# Patient Record
Sex: Female | Born: 1935 | Race: White | Hispanic: No | Smoking: Never smoker
Health system: Southern US, Community
[De-identification: ages and names within clinical notes are randomized; demographics above are authoritative.]

## PROBLEM LIST (undated history)

## (undated) DIAGNOSIS — I499 Cardiac arrhythmia, unspecified: Secondary | ICD-10-CM

## (undated) DIAGNOSIS — R0600 Dyspnea, unspecified: Secondary | ICD-10-CM

## (undated) DIAGNOSIS — Z972 Presence of dental prosthetic device (complete) (partial): Secondary | ICD-10-CM

## (undated) DIAGNOSIS — M1990 Unspecified osteoarthritis, unspecified site: Secondary | ICD-10-CM

## (undated) DIAGNOSIS — I509 Heart failure, unspecified: Secondary | ICD-10-CM

## (undated) DIAGNOSIS — E049 Nontoxic goiter, unspecified: Secondary | ICD-10-CM

## (undated) DIAGNOSIS — G4700 Insomnia, unspecified: Secondary | ICD-10-CM

## (undated) DIAGNOSIS — Z974 Presence of external hearing-aid: Secondary | ICD-10-CM

## (undated) DIAGNOSIS — I4891 Unspecified atrial fibrillation: Secondary | ICD-10-CM

## (undated) DIAGNOSIS — R42 Dizziness and giddiness: Secondary | ICD-10-CM

## (undated) HISTORY — PX: TONSILLECTOMY: SUR1361

## (undated) HISTORY — DX: Heart failure, unspecified: I50.9

## (undated) HISTORY — PX: CHOLECYSTECTOMY: SHX55

## (undated) HISTORY — PX: COLONOSCOPY: SHX174

## (undated) HISTORY — PX: HERNIA REPAIR: SHX51

---

## 2006-06-06 ENCOUNTER — Ambulatory Visit: Payer: Self-pay | Admitting: Unknown Physician Specialty

## 2006-10-31 ENCOUNTER — Ambulatory Visit: Payer: Self-pay | Admitting: *Deleted

## 2008-01-01 ENCOUNTER — Ambulatory Visit: Payer: Self-pay | Admitting: *Deleted

## 2009-05-05 ENCOUNTER — Inpatient Hospital Stay: Payer: Self-pay | Admitting: Internal Medicine

## 2010-04-20 ENCOUNTER — Ambulatory Visit: Payer: Self-pay | Admitting: Family Medicine

## 2012-02-04 ENCOUNTER — Ambulatory Visit: Payer: Self-pay | Admitting: Unknown Physician Specialty

## 2013-03-22 ENCOUNTER — Ambulatory Visit: Payer: Self-pay | Admitting: Family Medicine

## 2013-08-20 ENCOUNTER — Ambulatory Visit: Payer: Self-pay

## 2013-08-21 ENCOUNTER — Ambulatory Visit: Payer: Self-pay | Admitting: Family Medicine

## 2013-10-23 ENCOUNTER — Ambulatory Visit: Payer: Self-pay | Admitting: Family Medicine

## 2013-11-17 ENCOUNTER — Ambulatory Visit: Payer: Self-pay | Admitting: Physician Assistant

## 2013-11-20 ENCOUNTER — Ambulatory Visit: Payer: Self-pay | Admitting: Physician Assistant

## 2013-12-16 ENCOUNTER — Ambulatory Visit: Payer: Self-pay | Admitting: Emergency Medicine

## 2014-04-03 ENCOUNTER — Ambulatory Visit: Payer: Self-pay | Admitting: Internal Medicine

## 2014-05-15 ENCOUNTER — Ambulatory Visit: Payer: Self-pay | Admitting: Family Medicine

## 2014-05-15 LAB — URINALYSIS, COMPLETE
Bacteria: NEGATIVE
Bilirubin,UR: NEGATIVE
Blood: NEGATIVE
Glucose,UR: NEGATIVE mg/dL (ref 0–75)
Hyaline Cast: >30
Ketone: NEGATIVE
Nitrite: NEGATIVE
Ph: 6 (ref 4.5–8.0)
Protein: NEGATIVE
Specific Gravity: 1.025 (ref 1.003–1.030)

## 2014-05-15 LAB — COMPREHENSIVE METABOLIC PANEL
Albumin: 3.5 g/dL (ref 3.4–5.0)
Alkaline Phosphatase: 98 U/L
Anion Gap: 12 (ref 7–16)
BUN: 13 mg/dL (ref 7–18)
Bilirubin,Total: 0.2 mg/dL (ref 0.2–1.0)
Calcium, Total: 8.9 mg/dL (ref 8.5–10.1)
Chloride: 104 mmol/L (ref 98–107)
Co2: 25 mmol/L (ref 21–32)
Creatinine: 1.16 mg/dL (ref 0.60–1.30)
EGFR (African American): 53 — ABNORMAL LOW
EGFR (Non-African Amer.): 46 — ABNORMAL LOW
Glucose: 106 mg/dL — ABNORMAL HIGH (ref 65–99)
Osmolality: 282 (ref 275–301)
Potassium: 4.1 mmol/L (ref 3.5–5.1)
SGOT(AST): 17 U/L (ref 15–37)
SGPT (ALT): 23 U/L (ref 12–78)
Sodium: 141 mmol/L (ref 136–145)
Total Protein: 6.9 g/dL (ref 6.4–8.2)

## 2014-05-15 LAB — CBC WITH DIFFERENTIAL/PLATELET
Basophil #: 0.1 10*3/uL (ref 0.0–0.1)
Basophil %: 1 %
Eosinophil #: 0.1 10*3/uL (ref 0.0–0.7)
Eosinophil %: 2.1 %
HCT: 36.9 % (ref 35.0–47.0)
HGB: 11.5 g/dL — ABNORMAL LOW (ref 12.0–16.0)
Lymphocyte #: 1.1 10*3/uL (ref 1.0–3.6)
Lymphocyte %: 19.6 %
MCH: 25.4 pg — ABNORMAL LOW (ref 26.0–34.0)
MCHC: 31.3 g/dL — ABNORMAL LOW (ref 32.0–36.0)
MCV: 81 fL (ref 80–100)
Monocyte #: 0.7 x10 3/mm (ref 0.2–0.9)
Monocyte %: 12.1 %
Neutrophil #: 3.7 10*3/uL (ref 1.4–6.5)
Neutrophil %: 65.2 %
Platelet: 268 10*3/uL (ref 150–440)
RBC: 4.54 10*6/uL (ref 3.80–5.20)
RDW: 17 % — ABNORMAL HIGH (ref 11.5–14.5)
WBC: 5.7 10*3/uL (ref 3.6–11.0)

## 2014-05-15 LAB — TSH: Thyroid Stimulating Horm: 1.63 u[IU]/mL

## 2014-08-22 ENCOUNTER — Ambulatory Visit: Payer: Self-pay

## 2014-10-03 ENCOUNTER — Emergency Department: Payer: Self-pay | Admitting: Emergency Medicine

## 2014-10-03 LAB — URINALYSIS, COMPLETE
Bacteria: NONE SEEN
Bilirubin,UR: NEGATIVE
Glucose,UR: NEGATIVE mg/dL (ref 0–75)
Hyaline Cast: 2
Ketone: NEGATIVE
Leukocyte Esterase: NEGATIVE
Nitrite: NEGATIVE
Ph: 6 (ref 4.5–8.0)
Protein: NEGATIVE
RBC,UR: 1 /HPF (ref 0–5)
Specific Gravity: 1.017 (ref 1.003–1.030)
Squamous Epithelial: NONE SEEN
WBC UR: 1 /HPF (ref 0–5)

## 2014-10-03 LAB — CBC WITH DIFFERENTIAL/PLATELET
Basophil #: 0.1 10*3/uL (ref 0.0–0.1)
Basophil %: 0.5 %
Eosinophil #: 0 10*3/uL (ref 0.0–0.7)
Eosinophil %: 0.2 %
HCT: 42.6 % (ref 35.0–47.0)
HGB: 13.2 g/dL (ref 12.0–16.0)
Lymphocyte #: 0.9 10*3/uL — ABNORMAL LOW (ref 1.0–3.6)
Lymphocyte %: 8.6 %
MCH: 27.5 pg (ref 26.0–34.0)
MCHC: 30.9 g/dL — ABNORMAL LOW (ref 32.0–36.0)
MCV: 89 fL (ref 80–100)
Monocyte #: 0.4 x10 3/mm (ref 0.2–0.9)
Monocyte %: 3.7 %
Neutrophil #: 9.4 10*3/uL — ABNORMAL HIGH (ref 1.4–6.5)
Neutrophil %: 87 %
Platelet: 232 10*3/uL (ref 150–440)
RBC: 4.79 10*6/uL (ref 3.80–5.20)
RDW: 15 % — ABNORMAL HIGH (ref 11.5–14.5)
WBC: 10.8 10*3/uL (ref 3.6–11.0)

## 2014-10-03 LAB — COMPREHENSIVE METABOLIC PANEL
Albumin: 3.5 g/dL (ref 3.4–5.0)
Alkaline Phosphatase: 98 U/L
Anion Gap: 10 (ref 7–16)
BUN: 15 mg/dL (ref 7–18)
Bilirubin,Total: 0.6 mg/dL (ref 0.2–1.0)
Calcium, Total: 8.6 mg/dL (ref 8.5–10.1)
Chloride: 108 mmol/L — ABNORMAL HIGH (ref 98–107)
Co2: 23 mmol/L (ref 21–32)
Creatinine: 0.9 mg/dL (ref 0.60–1.30)
EGFR (African American): >60
EGFR (Non-African Amer.): >60
Glucose: 105 mg/dL — ABNORMAL HIGH (ref 65–99)
Osmolality: 282 (ref 275–301)
Potassium: 4.3 mmol/L (ref 3.5–5.1)
SGOT(AST): 32 U/L (ref 15–37)
SGPT (ALT): 27 U/L
Sodium: 141 mmol/L (ref 136–145)
Total Protein: 6.9 g/dL (ref 6.4–8.2)

## 2014-10-03 LAB — LIPASE, BLOOD: Lipase: 151 U/L (ref 73–393)

## 2014-10-03 LAB — APTT: Activated PTT: 26.4 secs (ref 23.6–35.9)

## 2014-10-03 LAB — PROTIME-INR
INR: 1
Prothrombin Time: 13.3 secs (ref 11.5–14.7)

## 2015-03-28 DIAGNOSIS — I480 Paroxysmal atrial fibrillation: Secondary | ICD-10-CM | POA: Diagnosis present

## 2015-03-28 DIAGNOSIS — E782 Mixed hyperlipidemia: Secondary | ICD-10-CM | POA: Diagnosis present

## 2015-05-27 ENCOUNTER — Ambulatory Visit
Admission: EM | Admit: 2015-05-27 | Discharge: 2015-05-27 | Disposition: A | Discharge status: Home / Self Care | Payer: Medicare Other | Attending: Internal Medicine | Admitting: Internal Medicine

## 2015-05-27 ENCOUNTER — Other Ambulatory Visit: Payer: Self-pay

## 2015-05-27 ENCOUNTER — Encounter: Payer: Self-pay | Admitting: Emergency Medicine

## 2015-05-27 DIAGNOSIS — R42 Dizziness and giddiness: Secondary | ICD-10-CM

## 2015-05-27 DIAGNOSIS — I4891 Unspecified atrial fibrillation: Secondary | ICD-10-CM | POA: Diagnosis not present

## 2015-05-27 DIAGNOSIS — G4700 Insomnia, unspecified: Secondary | ICD-10-CM

## 2015-05-27 DIAGNOSIS — Z79899 Other long term (current) drug therapy: Secondary | ICD-10-CM | POA: Diagnosis not present

## 2015-05-27 DIAGNOSIS — F5104 Psychophysiologic insomnia: Secondary | ICD-10-CM | POA: Insufficient documentation

## 2015-05-27 DIAGNOSIS — R0981 Nasal congestion: Secondary | ICD-10-CM | POA: Diagnosis not present

## 2015-05-27 HISTORY — DX: Insomnia, unspecified: G47.00

## 2015-05-27 HISTORY — DX: Unspecified atrial fibrillation: I48.91

## 2015-05-27 MED ORDER — FLUTICASONE PROPIONATE 50 MCG/ACT NA SUSP: 2.0000 | Freq: Every day | NASAL | Status: DC

## 2015-05-27 NOTE — ED Provider Notes (Addendum)
CSN: [id number]     Arrival date & time 05/27/15  6237 History   First MD Initiated Contact with Patient 05/27/15 [phone]     Chief Complaint  Patient presents with  . Dizziness   HPI  Patient reports dizziness, also sneezing, nasal congestion, occasional nose irritation and bleeding. She has additional concerns about gradually progressive decline in activity level over the last year or so, has to stop and rest more often than she had previously. Chronic insomnia, recent change in term Ambien therapy which she is upset about. Still remains very active, cares for horses, cleans houses, most grass, but is able to tolerate less activity than she had in the past. Under Dr. Alveria Apley care, and cardiology. PCP is Dr. Hoy Morn. No fevers. Not falling down. Describes dizziness as rotatory, but is most pronounced when she is changing position, going from sitting to standing.  Past Medical History  Diagnosis Date  . Insomnia   . A-fib    History reviewed. No pertinent past surgical history. No family history on file. History  Substance Use Topics  . Smoking status: Never Smoker   . Smokeless tobacco: Never Used  . Alcohol Use: No   OB History    No data available     Review of Systems  All other systems reviewed and are negative.   Allergies  Review of patient's allergies indicates no known allergies.  Home Medications   Prior to Admission medications   Medication Sig Start Date End Date Taking? Authorizing Provider  zolpidem (AMBIEN) 5 MG tablet Take 5 mg by mouth at bedtime as needed for sleep.   Yes Historical Provider, MD  Trazodone 50mg   Takes 2 qhs intermittently. Zolpidem decreased from 10mg  qhs a month or so ago, takes 10mg  qod now.  BP 141/75 mmHg  Pulse 60  Temp(Src) 98 F (36.7 C) (Oral)  Resp 16  Ht 5' 8.5" (1.74 m)  Wt 157 lb (71.215 kg)  BMI 23.52 kg/m2  SpO2 99% Physical Exam  Constitutional: She is oriented to person, place, and time. No distress.  Alert, nicely  groomed  HENT:  Head: Atraumatic.  Bilateral TMs dull, no erythema Pronounced nasal congestion bilaterally Throat with scant post nasal drainage  Eyes:  Conjugate gaze, no eye redness/drainage  Neck: Neck supple.  Cardiovascular: Normal rate and regular rhythm.   Pulmonary/Chest: No respiratory distress. She has no wheezes. She has no rales.  Lungs clear, symmetric breath sounds  Abdominal: She exhibits no distension.  Musculoskeletal: Normal range of motion.  No leg swelling  Neurological: She is alert and oriented to person, place, and time.  Skin: Skin is warm and dry.  No cyanosis  Nursing note and vitals reviewed.   ED Course  Procedures (including critical care time) Labs Review Labs Reviewed - No data to display  Imaging Review No results found.   EKG was performed in the urgent care, and was notable for sinus bradycardia, no acute ST or T-wave changes appreciated. Heart rate 58. Normal axis.  Orthostatic VS for the past 24 hrs:  BP- Lying Pulse- Lying BP- Sitting Pulse- Sitting BP- Standing at 0 minutes Pulse- Standing at 0 minutes  05/27/15 0918 128/60 mmHg 58 126/70 mmHg 61 111/57 mmHg 68      MDM   1. Sinus congestion   2. Dizziness   3. Insomnia    Prescription for nasal steroid, to try and improve sinus congestion, which may contribute to dizziness. Encouraged her to drink extra fluids, because of  the orthostatic opponent of her dizziness. Discussed the possibility that change in medications, including dose change in Ambien recently, or intermittent use of trazodone, may be contributing to dizziness. Follow-up with Dr. Ruby Cola in the next week or 2.    Sherlene Shams, MD 05/27/15 1020  Sherlene Shams, MD 05/27/15 2102

## 2015-05-27 NOTE — ED Notes (Signed)
Has been going on for two months- room spinning sensation. Cannot walk straight. Feels like she is going sideways. Reports that her ears have been itching as well. Reports worsening SOB and burning chest pain when she "goes out to feed the horses" or other activity.

## 2015-05-27 NOTE — Discharge Instructions (Signed)
Try nasal steroid spray for congestion, to see if dizziness improves.  Dizziness can also be related to mild dehydration or changes in medication.  Prescription for nasal steroid spray sent to pharmacy.  Dizziness Dizziness is a common problem. It is a feeling of unsteadiness or light-headedness. You may feel like you are about to faint. Dizziness can lead to injury if you stumble or fall. A person of any age group can suffer from dizziness, but dizziness is more common in older adults. CAUSES  Dizziness can be caused by many different things, including:  Middle ear problems.  Standing for too long.  Infections.  An allergic reaction.  Aging.  An emotional response to something, such as the sight of blood.  Side effects of medicines.  Tiredness.  Problems with circulation or blood pressure.  Excessive use of alcohol or medicines, or illegal drug use.  Breathing too fast (hyperventilation).  An irregular heart rhythm (arrhythmia).  A low red blood cell count (anemia).  Pregnancy.  Vomiting, diarrhea, fever, or other illnesses that cause body fluid loss (dehydration).  Diseases or conditions such as Parkinson's disease, high blood pressure (hypertension), diabetes, and thyroid problems.  Exposure to extreme heat. DIAGNOSIS  Your health care provider will ask about your symptoms, perform a physical exam, and perform an electrocardiogram (ECG) to record the electrical activity of your heart. Your health care provider may also perform other heart or blood tests to determine the cause of your dizziness. These may include:  Transthoracic echocardiogram (TTE). During echocardiography, sound waves are used to evaluate how blood flows through your heart.  Transesophageal echocardiogram (TEE).  Cardiac monitoring. This allows your health care provider to monitor your heart rate and rhythm in real time.  Holter monitor. This is a portable device that records your heartbeat and  can help diagnose heart arrhythmias. It allows your health care provider to track your heart activity for several days if needed.  Stress tests by exercise or by giving medicine that makes the heart beat faster. TREATMENT  Treatment of dizziness depends on the cause of your symptoms and can vary greatly. HOME CARE INSTRUCTIONS   Drink enough fluids to keep your urine clear or pale yellow. This is especially important in very hot weather. In older adults, it is also important in cold weather.  Take your medicine exactly as directed if your dizziness is caused by medicines. When taking blood pressure medicines, it is especially important to get up slowly.  Rise slowly from chairs and steady yourself until you feel okay.  In the morning, first sit up on the side of the bed. When you feel okay, stand slowly while holding onto something until you know your balance is fine.  Move your legs often if you need to stand in one place for a long time. Tighten and relax your muscles in your legs while standing.  Have someone stay with you for 1-2 days if dizziness continues to be a problem. Do this until you feel you are well enough to stay alone. Have the person call your health care provider if he or she notices changes in you that are concerning.  Do not drive or use heavy machinery if you feel dizzy.  Do not drink alcohol. SEEK IMMEDIATE MEDICAL CARE IF:   Your dizziness or light-headedness gets worse.  You feel nauseous or vomit.  You have problems talking, walking, or using your arms, hands, or legs.  You feel weak.  You are not thinking clearly or you  have trouble forming sentences. It may take a friend or family member to notice this.  You have chest pain, abdominal pain, shortness of breath, or sweating.  Your vision changes.  You notice any bleeding.  You have side effects from medicine that seems to be getting worse rather than better. MAKE SURE YOU:   Understand these  instructions.  Will watch your condition.  Will get help right away if you are not doing well or get worse. Document Released: 06/01/2001 Document Revised: 12/11/2013 Document Reviewed: 06/25/2011 [facility] Patient Information 2015 [place], [place]. This information is not intended to replace advice given to you by your health care provider. Make sure you discuss any questions you have with your health care provider.

## 2016-05-22 ENCOUNTER — Emergency Department: Payer: Medicare Other

## 2016-05-22 ENCOUNTER — Emergency Department
Admission: EM | Admit: 2016-05-22 | Discharge: 2016-05-22 | Disposition: A | Discharge status: Home / Self Care | Payer: Medicare Other | Attending: Emergency Medicine | Admitting: Emergency Medicine

## 2016-05-22 DIAGNOSIS — Z79899 Other long term (current) drug therapy: Secondary | ICD-10-CM | POA: Insufficient documentation

## 2016-05-22 DIAGNOSIS — Z7951 Long term (current) use of inhaled steroids: Secondary | ICD-10-CM | POA: Diagnosis not present

## 2016-05-22 DIAGNOSIS — R0789 Other chest pain: Secondary | ICD-10-CM | POA: Insufficient documentation

## 2016-05-22 DIAGNOSIS — I4891 Unspecified atrial fibrillation: Secondary | ICD-10-CM | POA: Diagnosis not present

## 2016-05-22 DIAGNOSIS — R079 Chest pain, unspecified: Secondary | ICD-10-CM

## 2016-05-22 LAB — BASIC METABOLIC PANEL
Anion gap: 9 (ref 5–15)
BUN: 18 mg/dL (ref 6–20)
CO2: 22 mmol/L (ref 22–32)
Calcium: 9.2 mg/dL (ref 8.9–10.3)
Chloride: 104 mmol/L (ref 101–111)
Creatinine, Ser: 0.86 mg/dL (ref 0.44–1.00)
GFR calc Af Amer: >60 mL/min (ref >60)
GFR calc non Af Amer: >60 mL/min (ref >60)
Glucose, Bld: 115 mg/dL — ABNORMAL HIGH (ref 65–99)
Potassium: 4.5 mmol/L (ref 3.5–5.1)
Sodium: 135 mmol/L (ref 135–145)

## 2016-05-22 LAB — CBC
HCT: 40.4 % (ref 35.0–47.0)
Hemoglobin: 13.4 g/dL (ref 12.0–16.0)
MCH: 28.8 pg (ref 26.0–34.0)
MCHC: 33.1 g/dL (ref 32.0–36.0)
MCV: 87 fL (ref 80.0–100.0)
Platelets: 242 10*3/uL (ref 150–440)
RBC: 4.65 MIL/uL (ref 3.80–5.20)
RDW: 14.2 % (ref 11.5–14.5)
WBC: 8.3 10*3/uL (ref 3.6–11.0)

## 2016-05-22 LAB — TROPONIN I: Troponin I: <0.03 ng/mL (ref <0.031)

## 2016-05-22 MED ORDER — RANITIDINE HCL 75 MG PO TABS: 75.0000 mg | ORAL_TABLET | Freq: Two times a day (BID) | ORAL | Status: DC

## 2016-05-22 MED ORDER — GI COCKTAIL ~~LOC~~
30.0000 mL | Freq: Once | ORAL | Status: AC
  Administered 2016-05-22: 30 mL via ORAL
  Filled 2016-05-22: qty 30

## 2016-05-22 NOTE — ED Notes (Addendum)
Pt reports chest pain and pressure for past 3 days that has gotten worse since last night. Pt reports heavy feeling like a knot nausea and sob.

## 2016-05-22 NOTE — ED Notes (Signed)
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 Pt states that she has heaviness in her chest, states that she has more effort to take a breath, pt is tender with light palpation to the epigastric area, breath sounds clear, pt states an episode of a.fib at one point, ekg in nsr at this time. No distress noted at this time, additional warm blanket given for comfort

## 2016-05-22 NOTE — ED Provider Notes (Signed)
[facility] Emergency Department Provider Note   ____________________________________________  Time seen: Approximately 230 PM  I have reviewed the triage vital signs and the nursing notes.   HISTORY  Chief Complaint Chest Pain   HPI Katelyn Lee is a 80 y.o. female with a history of insomnia and atrial fibrillation is presenting to the emergency department today after experiencing 24 hours of chest pressure. 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 She says that the anxiety made her not be able to sleep and then she started to experience chest pressure which lasted several hours. She says that she is much improved with the chest pressure is continuing and constant. Says that she has felt like this in the past and was having atrial fibrillation. However, the atrial fibrillation was isolated and she is not on any medications for this at this time. It was recommended that she take a baby aspirin but she does not. She says that she feels a heaviness across the front of her chest which does not radiate. Does not describe it as pain. Says that she has not had any vomiting but does feel nauseous. Says that she eats a lot of fast food and has a history of indigestion. Says that she does not hurt after she eats but does feel that she needs to belch. Denies any pain with deep inspiration.Says that she still feels nauseous.   Past Medical History  Diagnosis Date  . Insomnia   . A-fib     There are no active problems to display for this patient.   No past surgical history on file.  Current Outpatient Rx  Name  Route  Sig  Dispense  Refill  . fluticasone (FLONASE) 50 MCG/ACT nasal spray   Each Nare   Place 2 sprays into both nostrils daily.   16 g   0   . zolpidem (AMBIEN) 5 MG tablet   Oral   Take 5 mg by mouth at bedtime as needed for sleep.           Allergies Review of patient's allergies indicates no  known allergies.  No family history on file.  Social History Social History  Substance Use Topics  . Smoking status: Never Smoker   . Smokeless tobacco: Never Used  . Alcohol Use: No    Review of Systems Constitutional: No fever/chills Eyes: No visual changes. ENT: No sore throat. Cardiovascular: As above Respiratory: As above Gastrointestinal:   No constipation. Genitourinary: Negative for dysuria. Musculoskeletal: Negative for back pain. Skin: Negative for rash. Neurological: Negative for headaches, focal weakness or numbness.  10-point ROS otherwise negative.  ____________________________________________   PHYSICAL EXAM:  VITAL SIGNS: ED Triage Vitals  Enc Vitals Group     BP 05/22/16 1114 141/75 mmHg     Pulse Rate 05/22/16 1114 73     Resp 05/22/16 1114 18     Temp 05/22/16 1114 98.7 F (37.1 C)     Temp Source 05/22/16 1114 Oral     SpO2 05/22/16 1114 100 %     Weight 05/22/16 1114 150 lb (68.04 kg)     Height 05/22/16 1114 5\' 8"  (1.727 m)     Head Cir --      Peak Flow --      Pain Score 05/22/16 1114 0     Pain Loc --      Pain Edu? --      Excl. in [place]? --  Constitutional: Alert and oriented. Well appearing and in no acute distress. Eyes: Conjunctivae are normal. PERRL. EOMI. Head: Atraumatic. Nose: No congestion/rhinnorhea. Mouth/Throat: Mucous membranes are moist.   Neck: No stridor.   Cardiovascular: Normal rate, regular rhythm. Grossly normal heart sounds.  Good peripheral circulation.Chest pain is reproducible to palpation over the sternum. Respiratory: Normal respiratory effort.  No retractions. Lungs CTAB. Gastrointestinal: Soft with mild left upper quadrant tenderness palpation. No rebound or guarding. Negative Murphy sign. No right upper quadrant tenderness to palpation. No distention.Marland KitchenNo CVA tenderness. Musculoskeletal: No lower extremity tenderness nor edema.  No joint effusions. Neurologic:  Normal speech and language. No gross focal  neurologic deficits are appreciated. No gait instability. Skin:  Skin is warm, dry and intact. No rash noted. Psychiatric: Mood and affect are normal. Speech and behavior are normal.  ____________________________________________   LABS (all labs ordered are listed, but only abnormal results are displayed)  Labs Reviewed  BASIC METABOLIC PANEL - Abnormal; Notable for the following:    Glucose, Bld 115 (*)    All other components within normal limits  CBC  TROPONIN I   ____________________________________________  EKG  ED ECG REPORT I, Doran Stabler, the attending physician, personally viewed and interpreted this ECG.   Date: 05/22/2016  EKG Time: 1111  Rate: 78  Rhythm: normal sinus rhythm  Axis: Normal  Intervals:none  ST&T Change: No ST segment elevation or depression. No abnormal T-wave inversion.  ____________________________________________  [id number]   DG Chest 2 View (Final result) Result time: 05/22/16 11:53:25   Final result by Rad Results In Interface (05/22/16 11:53:25)   Narrative:   CLINICAL DATA: Chest pain and pressure beginning 3 days ago, worsening.  EXAM: CHEST 2 VIEW  COMPARISON: 05/05/2009  FINDINGS: Mild hyperinflation. Biapical scarring. Heart is normal size. No confluent airspace opacities or effusions. No acute bony abnormality.  IMPRESSION: Hyperinflation/COPD. Chronic changes. No active disease.   Electronically Signed By: Rolm Baptise M.D. On: 05/22/2016 11:53    ____________________________________________   PROCEDURES   ____________________________________________   INITIAL IMPRESSION / ASSESSMENT AND PLAN / ED COURSE  Pertinent labs & imaging results that were available during my care of the patient were reviewed by me and considered in my medical decision making (see chart for details).  ----------------------------------------- 3:16 PM on  05/22/2016 -----------------------------------------  Patient awaiting GI cocktail at this time. Signed out to Dr. Marcelene Butte. Very reassuring workup after about 18 hours of symptoms. Feel that this is likely anxiety or GI related. ____________________________________________   FINAL CLINICAL IMPRESSION(S) / ED DIAGNOSES  Chest pain.    NEW MEDICATIONS STARTED DURING THIS VISIT:  New Prescriptions   No medications on file     Note:  This document was prepared using Dragon voice recognition software and may include unintentional dictation errors.    Orbie Pyo, MD 05/22/16 [phone]

## 2016-05-22 NOTE — ED Provider Notes (Signed)
-----------------------------------------   3:57 PM on 05/22/2016 -----------------------------------------   Blood pressure 159/78, pulse 73, temperature 98.7 F (37.1 C), temperature source Oral, resp. rate 12, height 5\' 8"  (1.727 m), weight 150 lb (68.04 kg), SpO2 100 %.  Assuming care from Dr. Konrad Penta.  In short, Katelyn Lee is a 80 y.o. female with a chief complaint of Chest Pain .  Refer to the original H&P for additional details.  The current plan of care is to follow patient clinically and if she improves with a GI cocktail visibly more evidence that this is not a life-threatening pathology patient be discharged on antacid medications and does feel improved after the GI cocktail. All questions and concerns were addressed at the bedside the patient was discharged in stable and improved condition.   Daymon Larsen, MD 05/22/16 [phone]

## 2016-08-29 ENCOUNTER — Ambulatory Visit
Admission: EM | Admit: 2016-08-29 | Discharge: 2016-08-29 | Disposition: A | Discharge status: Home / Self Care | Payer: Medicare Other | Attending: Family Medicine | Admitting: Family Medicine

## 2016-08-29 ENCOUNTER — Telehealth: Payer: Self-pay

## 2016-08-29 ENCOUNTER — Encounter: Payer: Self-pay | Admitting: Emergency Medicine

## 2016-08-29 DIAGNOSIS — J3089 Other allergic rhinitis: Secondary | ICD-10-CM | POA: Diagnosis not present

## 2016-08-29 DIAGNOSIS — J069 Acute upper respiratory infection, unspecified: Secondary | ICD-10-CM

## 2016-08-29 DIAGNOSIS — I4891 Unspecified atrial fibrillation: Secondary | ICD-10-CM | POA: Diagnosis present

## 2016-08-29 LAB — TROPONIN I: Troponin I: <0.03 ng/mL (ref <0.03)

## 2016-08-29 LAB — BASIC METABOLIC PANEL
Anion gap: 8 (ref 5–15)
BUN: 10 mg/dL (ref 6–20)
CO2: 26 mmol/L (ref 22–32)
Calcium: 9.5 mg/dL (ref 8.9–10.3)
Chloride: 104 mmol/L (ref 101–111)
Creatinine, Ser: 0.84 mg/dL (ref 0.44–1.00)
GFR calc Af Amer: >60 mL/min (ref >60)
GFR calc non Af Amer: >60 mL/min (ref >60)
Glucose, Bld: 110 mg/dL — ABNORMAL HIGH (ref 65–99)
Potassium: 3.9 mmol/L (ref 3.5–5.1)
Sodium: 138 mmol/L (ref 135–145)

## 2016-08-29 LAB — CBC WITH DIFFERENTIAL/PLATELET
Basophils Absolute: 0.1 10*3/uL (ref 0–0.1)
Basophils Relative: 1 %
Eosinophils Absolute: 0.2 10*3/uL (ref 0–0.7)
Eosinophils Relative: 3 %
HCT: 43.7 % (ref 35.0–47.0)
Hemoglobin: 14.3 g/dL (ref 12.0–16.0)
Lymphocytes Relative: 27 %
Lymphs Abs: 1.7 10*3/uL (ref 1.0–3.6)
MCH: 29 pg (ref 26.0–34.0)
MCHC: 32.8 g/dL (ref 32.0–36.0)
MCV: 88.3 fL (ref 80.0–100.0)
Monocytes Absolute: 0.4 10*3/uL (ref 0.2–0.9)
Monocytes Relative: 6 %
Neutro Abs: 4.1 10*3/uL (ref 1.4–6.5)
Neutrophils Relative %: 63 %
Platelets: 231 10*3/uL (ref 150–440)
RBC: 4.94 MIL/uL (ref 3.80–5.20)
RDW: 14.3 % (ref 11.5–14.5)
WBC: 6.5 10*3/uL (ref 3.6–11.0)

## 2016-08-29 LAB — CKMB (ARMC ONLY): CK, MB: 1.2 ng/mL (ref 0.5–5.0)

## 2016-08-29 NOTE — Discharge Instructions (Signed)
Will call you with lab results and if any significant problems with the results will recommend going to the ER for further evaluation and treatment. Patient addresses understanding of this. I've advised her to stop the decongestant and just take an oral antihistamine, rest, hydration, Tylenol when necessary.

## 2016-08-29 NOTE — ED Provider Notes (Signed)
[facility]    CSN: [id number] Arrival date & time: 08/29/16  0801  First Provider Contact:  None       History   Chief Complaint Chief Complaint  Patient presents with  . Nasal Congestion    HPI TYRISHA FITZHENRY is a 80 y.o. female.   HPI: Patient presents today with symptoms of nasal congestion, fatigue, decreased appetite, chest discomfort with cough at times. She denies any documented fever or chills. She has had the symptoms for the last few days. She denies any abdominal pain, nausea, vomiting. She did have episode of diarrhea a few days ago when the symptoms started. She does have history of A. fib but only takes aspirin daily. She did try taking a decongestant but did not feel well when she took it so she stopped taking it.  Past Medical History:  Diagnosis Date  . A-fib ([place])   . Insomnia     There are no active problems to display for this patient.   Past Surgical History:  Procedure Laterality Date  . CHOLECYSTECTOMY      OB History    No data available       Home Medications    Prior to Admission medications   Medication Sig Start Date End Date Taking? Authorizing Provider  aspirin 81 MG chewable tablet Chew 81 mg by mouth daily.   Yes Historical Provider, MD  fluticasone (FLONASE) 50 MCG/ACT nasal spray Place 2 sprays into both nostrils daily. 05/27/15   Sherlene Shams, MD  ranitidine (ZANTAC) 75 MG tablet Take 1 tablet (75 mg total) by mouth 2 (two) times daily. 05/22/16   Orbie Pyo, MD  zolpidem (AMBIEN) 5 MG tablet Take 5 mg by mouth at bedtime as needed for sleep.    Historical Provider, MD    Family History History reviewed. No pertinent family history.  Social History Social History  Substance Use Topics  . Smoking status: Never Smoker  . Smokeless tobacco: Never Used  . Alcohol use No     Allergies   Review of patient's allergies indicates no known allergies.   Review of Systems Review of Systems: Negative  except mentioned above.   Physical Exam Triage Vital Signs ED Triage Vitals  Enc Vitals Group     BP 08/29/16 0821 136/79     Pulse Rate 08/29/16 0821 63     Resp 08/29/16 0821 16     Temp 08/29/16 0821 97.6 F (36.4 C)     Temp Source 08/29/16 0821 Tympanic     SpO2 08/29/16 0821 100 %     Weight 08/29/16 0822 150 lb (68 kg)     Height 08/29/16 0822 5\' 8"  (1.727 m)     Head Circumference --      Peak Flow --      Pain Score 08/29/16 0823 0     Pain Loc --      Pain Edu? --      Excl. in [place]? --    No data found.   Updated Vital Signs BP 136/79 (BP Location: Right Arm)   Pulse 63   Temp 97.6 F (36.4 C) (Tympanic)   Resp 16   Ht 5\' 8"  (1.727 m)   Wt 150 lb (68 kg)   SpO2 100%   BMI 22.81 kg/m      Physical Exam:  GENERAL: NAD HEENT: no pharyngeal erythema, no exudate, no erythema of TMs, +cerumen bilaterally, no cervical LAD RESP: CTA B CARD:  regular rate and rhythm appreciated  EXTREM: -[place] NEURO: CN II-XII grossly intact    UC Treatments / Results  Labs (all labs ordered are listed, but only abnormal results are displayed) Labs Reviewed - No data to display  EKG  EKG Interpretation None     ECG - NSR-64, no ST elevations or depressions appreciated, p waves present, moderate voltage criteria for LVH, may be normal variant, agree with print out reading of ECG   Radiology No results found.  Procedures Procedures (including critical care time)  Medications Ordered in UC Medications - No data to display   Initial Impression / Assessment and Plan / UC Course  I have reviewed the triage vital signs and the nursing notes.  Pertinent labs & imaging results that were available during my care of the patient were reviewed by me and considered in my medical decision making (see chart for details).  Clinical Course   A/P: URI, allergic rhinitis - discussed with patient to avoid taking decongestants and given history of atrial fibrillation, can take  oral antihistamine if needed such as Claritin, rest, hydration, Tylenol when necessary, follow-up with PMD if symptoms persist or worsen. I have done some cardiac labs as well as D-dimer and will call patient with results. If any concerning results will send patient to the ER for further evaluation and treatment. Patient addresses understanding of this.  Final Clinical Impressions(s) / UC Diagnoses   Final diagnoses:  None    New Prescriptions New Prescriptions   No medications on file     Paulina Fusi, MD 08/29/16 [phone]

## 2016-08-29 NOTE — ED Triage Notes (Signed)
Patient c/o cold symptoms, sneezing, and nasal congestion that started a week ago.  Patient denies fevers.

## 2017-05-04 ENCOUNTER — Other Ambulatory Visit: Payer: Self-pay | Admitting: Gastroenterology

## 2017-05-04 DIAGNOSIS — R1031 Right lower quadrant pain: Secondary | ICD-10-CM

## 2017-05-04 DIAGNOSIS — R1032 Left lower quadrant pain: Secondary | ICD-10-CM

## 2017-05-06 ENCOUNTER — Ambulatory Visit: Admission: RE | Admit: 2017-05-06 | Payer: Self-pay | Source: Ambulatory Visit

## 2017-06-27 ENCOUNTER — Encounter: Payer: Self-pay | Admitting: *Deleted

## 2017-06-28 ENCOUNTER — Ambulatory Visit
Admission: RE | Admit: 2017-06-28 | Discharge: 2017-06-28 | Disposition: A | Discharge status: Home / Self Care | Payer: Medicare HMO | Source: Ambulatory Visit | Attending: Gastroenterology | Admitting: Gastroenterology

## 2017-06-28 ENCOUNTER — Encounter: Payer: Self-pay | Admitting: *Deleted

## 2017-06-28 ENCOUNTER — Encounter
Admission: RE | Disposition: A | Discharge status: Home / Self Care | Payer: Self-pay | Source: Ambulatory Visit | Attending: Gastroenterology

## 2017-06-28 ENCOUNTER — Ambulatory Visit: Payer: Medicare HMO | Admitting: Anesthesiology

## 2017-06-28 DIAGNOSIS — G4700 Insomnia, unspecified: Secondary | ICD-10-CM | POA: Insufficient documentation

## 2017-06-28 DIAGNOSIS — Z79899 Other long term (current) drug therapy: Secondary | ICD-10-CM | POA: Insufficient documentation

## 2017-06-28 DIAGNOSIS — Q438 Other specified congenital malformations of intestine: Secondary | ICD-10-CM | POA: Diagnosis not present

## 2017-06-28 DIAGNOSIS — Z7901 Long term (current) use of anticoagulants: Secondary | ICD-10-CM | POA: Insufficient documentation

## 2017-06-28 DIAGNOSIS — D120 Benign neoplasm of cecum: Secondary | ICD-10-CM | POA: Insufficient documentation

## 2017-06-28 DIAGNOSIS — Z7982 Long term (current) use of aspirin: Secondary | ICD-10-CM | POA: Insufficient documentation

## 2017-06-28 DIAGNOSIS — K5730 Diverticulosis of large intestine without perforation or abscess without bleeding: Secondary | ICD-10-CM | POA: Insufficient documentation

## 2017-06-28 DIAGNOSIS — D123 Benign neoplasm of transverse colon: Secondary | ICD-10-CM | POA: Insufficient documentation

## 2017-06-28 DIAGNOSIS — K219 Gastro-esophageal reflux disease without esophagitis: Secondary | ICD-10-CM | POA: Diagnosis not present

## 2017-06-28 DIAGNOSIS — I4891 Unspecified atrial fibrillation: Secondary | ICD-10-CM | POA: Insufficient documentation

## 2017-06-28 DIAGNOSIS — K640 First degree hemorrhoids: Secondary | ICD-10-CM | POA: Insufficient documentation

## 2017-06-28 DIAGNOSIS — K625 Hemorrhage of anus and rectum: Secondary | ICD-10-CM | POA: Insufficient documentation

## 2017-06-28 HISTORY — DX: Nontoxic goiter, unspecified: E04.9

## 2017-06-28 HISTORY — DX: Cardiac arrhythmia, unspecified: I49.9

## 2017-06-28 HISTORY — PX: COLONOSCOPY WITH PROPOFOL: SHX5780

## 2017-06-28 HISTORY — DX: Dyspnea, unspecified: R06.00

## 2017-06-28 SURGERY — COLONOSCOPY WITH PROPOFOL
Anesthesia: General

## 2017-06-28 MED ORDER — EPHEDRINE SULFATE 50 MG/ML IJ SOLN
INTRAMUSCULAR | Status: AC
  Filled 2017-06-28: qty 1

## 2017-06-28 MED ORDER — PROPOFOL 500 MG/50ML IV EMUL
INTRAVENOUS | Status: AC
  Filled 2017-06-28: qty 50

## 2017-06-28 MED ORDER — EPHEDRINE SULFATE 50 MG/ML IJ SOLN
INTRAMUSCULAR | Status: DC | PRN
  Administered 2017-06-28: 10 mg via INTRAVENOUS
  Administered 2017-06-28: 5 mg via INTRAVENOUS

## 2017-06-28 MED ORDER — FENTANYL CITRATE (PF) 100 MCG/2ML IJ SOLN
INTRAMUSCULAR | Status: AC
  Filled 2017-06-28: qty 2

## 2017-06-28 MED ORDER — SODIUM CHLORIDE 0.9 % IV SOLN: INTRAVENOUS | Status: DC

## 2017-06-28 MED ORDER — MIDAZOLAM HCL 2 MG/2ML IJ SOLN
INTRAMUSCULAR | Status: AC
  Filled 2017-06-28: qty 2

## 2017-06-28 MED ORDER — FENTANYL CITRATE (PF) 100 MCG/2ML IJ SOLN
INTRAMUSCULAR | Status: DC | PRN
  Administered 2017-06-28 (×2): 50 ug via INTRAVENOUS

## 2017-06-28 MED ORDER — SODIUM CHLORIDE 0.9 % IV SOLN
INTRAVENOUS | Status: DC
  Administered 2017-06-28: 13:00:00 via INTRAVENOUS

## 2017-06-28 MED ORDER — MIDAZOLAM HCL 2 MG/2ML IJ SOLN
INTRAMUSCULAR | Status: DC | PRN
  Administered 2017-06-28 (×2): 1 mg via INTRAVENOUS

## 2017-06-28 MED ORDER — PROPOFOL 500 MG/50ML IV EMUL
INTRAVENOUS | Status: DC | PRN
  Administered 2017-06-28: 100 ug/kg/min via INTRAVENOUS

## 2017-06-28 NOTE — Anesthesia Post-op Follow-up Note (Cosign Needed)
Anesthesia QCDR form completed.        

## 2017-06-28 NOTE — Anesthesia Postprocedure Evaluation (Signed)
Anesthesia Post Note  Patient: Katelyn Lee  Procedure(s) Performed: Procedure(s) (LRB): COLONOSCOPY WITH PROPOFOL (N/A)  Patient location during evaluation: Endoscopy Anesthesia Type: General Level of consciousness: awake and alert Pain management: pain level controlled Vital Signs Assessment: post-procedure vital signs reviewed and stable Respiratory status: spontaneous breathing, nonlabored ventilation, respiratory function stable and patient connected to nasal cannula oxygen Cardiovascular status: blood pressure returned to baseline and stable Postop Assessment: no signs of nausea or vomiting Anesthetic complications: no     Last Vitals:  Vitals:   06/28/17 1423 06/28/17 1434  BP: (!) 116/100 134/76  Pulse: 71 72  Resp: 14 15  Temp:      Last Pain:  Vitals:   06/28/17 1404  TempSrc: Tympanic                 Precious Haws Karter Hellmer

## 2017-06-28 NOTE — Transfer of Care (Signed)
Immediate Anesthesia Transfer of Care Note  Patient: Katelyn Lee  Procedure(s) Performed: Procedure(s): COLONOSCOPY WITH PROPOFOL (N/A)  Patient Location: PACU  Anesthesia Type:General  Level of Consciousness: awake and sedated  Airway & Oxygen Therapy: Patient Spontanous Breathing and Patient connected to nasal cannula oxygen  Post-op Assessment: Report given to RN and Post -op Vital signs reviewed and stable  Post vital signs: Reviewed and stable  Last Vitals:  Vitals:   06/28/17 1234  BP: (!) 158/86  Pulse: 71  Resp: 16  Temp: (!) 36.3 C    Last Pain:  Vitals:   06/28/17 1234  TempSrc: Tympanic         Complications: No apparent anesthesia complications

## 2017-06-28 NOTE — H&P (Signed)
Outpatient short stay form Pre-procedure 06/28/2017 1:05 PM Lollie Sails MD  Primary Physician: Dr. Thereasa Distance  Reason for visit:  Colonoscopy  History of present illness:  Patient is a 81 year old female presenting today as above. She has a history of rectal bleeding that occurred several weeks ago that lasted for multiple days. She is also been experiencing some lower abdominal pain in association with constipation. She tolerated her prep well. Although her record reflects that she takes Coumadin/warfarin she denies taking this. She does take a 81 mg aspirin daily and no other aspirin products.    Current Facility-Administered Medications:  .  0.9 %  sodium chloride infusion, , Intravenous, Continuous, Lollie Sails, MD .  0.9 %  sodium chloride infusion, , Intravenous, Continuous, Lollie Sails, MD  Prescriptions Prior to Admission  Medication Sig Dispense Refill Last Dose  . gabapentin (NEURONTIN) 300 MG capsule Take 300 mg by mouth at bedtime.   06/27/2017 at Unknown time  . meclizine (ANTIVERT) 12.5 MG tablet Take 12.5 mg by mouth 3 (three) times daily as needed for dizziness.     . warfarin (COUMADIN) 2 MG tablet Take 2 mg by mouth daily.     Marland Kitchen aspirin 81 MG chewable tablet Chew 81 mg by mouth daily.     . fluticasone (FLONASE) 50 MCG/ACT nasal spray Place 2 sprays into both nostrils daily. 16 g 0   . metoprolol tartrate (LOPRESSOR) 25 MG tablet Take 25 mg by mouth 2 (two) times daily.   Not Taking at Unknown time  . ranitidine (ZANTAC) 75 MG tablet Take 1 tablet (75 mg total) by mouth 2 (two) times daily. 60 tablet 0   . zolpidem (AMBIEN) 5 MG tablet Take 5 mg by mouth at bedtime as needed for sleep.        No Known Allergies   Past Medical History:  Diagnosis Date  . A-fib ([place])   . Dyspnea   . Dysrhythmia    A-FIB  . Goiter   . Insomnia     Review of systems:      Physical Exam    Heart and lungs: Regular rate and rhythm without rub or gallop,  lungs are bilaterally clear.    HEENT: Normocephalic atraumatic eyes are anicteric    Other:     Pertinant exam for procedure: Soft nontender nondistended bowel sounds positive normoactive.    Planned proceedures: Colonoscopy and indicated procedures I have discussed the risks benefits and complications of procedures to include not limited to bleeding, infection, perforation and the risk of sedation and the patient wishes to proceed.    Lollie Sails, MD Gastroenterology 06/28/2017  1:05 PM

## 2017-06-28 NOTE — Anesthesia Preprocedure Evaluation (Signed)
Anesthesia Evaluation  Patient identified by MRN, date of birth, ID band Patient awake    Reviewed: Allergy & Precautions, H&P , NPO status , Patient's Chart, lab work & pertinent test results  History of Anesthesia Complications Negative for: history of anesthetic complications  Airway Mallampati: III  TM Distance: <3 FB Neck ROM: limited    Dental  (+) Poor Dentition, Missing, Upper Dentures, Lower Dentures   Pulmonary shortness of breath and with exertion,           Cardiovascular Exercise Tolerance: Good (-) angina+ dysrhythmias Atrial Fibrillation      Neuro/Psych negative neurological ROS  negative psych ROS   GI/Hepatic Neg liver ROS, GERD  Controlled,  Endo/Other  negative endocrine ROS  Renal/GU negative Renal ROS  negative genitourinary   Musculoskeletal   Abdominal   Peds  Hematology negative hematology ROS (+)   Anesthesia Other Findings Past Medical History: No date: A-fib ([facility]) No date: Dyspnea No date: Dysrhythmia     Comment: A-FIB No date: Goiter No date: Insomnia  Past Surgical History: No date: CHOLECYSTECTOMY No date: COLONOSCOPY No date: HERNIA REPAIR No date: TONSILLECTOMY     Reproductive/Obstetrics negative OB ROS                             Anesthesia Physical Anesthesia Plan  ASA: III  Anesthesia Plan: General   Post-op Pain Management:    Induction: Intravenous  PONV Risk Score and Plan:   Airway Management Planned: Natural Airway and Nasal Cannula  Additional Equipment:   Intra-op Plan:   Post-operative Plan:   Informed Consent: I have reviewed the patients History and Physical, chart, labs and discussed the procedure including the risks, benefits and alternatives for the proposed anesthesia with the patient or authorized representative who has indicated his/her understanding and acceptance.   Dental Advisory Given  Plan Discussed  with: Anesthesiologist, CRNA and Surgeon  Anesthesia Plan Comments: (Patient consented for risks of anesthesia including but not limited to:  - adverse reactions to medications - damage to teeth, lips or other oral mucosa - sore throat or hoarseness - Damage to heart, brain, lungs or loss of life  Patient voiced understanding.)        Anesthesia Quick Evaluation

## 2017-06-28 NOTE — Anesthesia Procedure Notes (Signed)
Performed by: COOK-MARTIN, Cynthia Stainback Pre-anesthesia Checklist: Patient identified, Emergency Drugs available, Suction available, Patient being monitored and Timeout performed Patient Re-evaluated:Patient Re-evaluated prior to inductionOxygen Delivery Method: Nasal cannula Preoxygenation: Pre-oxygenation with 100% oxygen Intubation Type: IV induction Placement Confirmation: positive ETCO2 and CO2 detector       

## 2017-06-28 NOTE — Op Note (Signed)
[facility] Gastroenterology Patient Name: Katelyn Lee Procedure Date: 06/28/2017 1:05 PM MRN: [id number] Account #: [id number] Date of Birth: 09-09-1936 Admit Type: Outpatient Age: 81 Room: [facility] ENDO ROOM 3 Gender: Female Note Status: Finalized Procedure:            Colonoscopy Indications:          Lower abdominal pain, Rectal bleeding Providers:            Lollie Sails, MD Referring MD:         Sofie Hartigan (Referring MD) Medicines:            Monitored Anesthesia Care Complications:        No immediate complications. Procedure:            Pre-Anesthesia Assessment:                       - ASA Grade Assessment: III - A patient with severe                        systemic disease.                       After obtaining informed consent, the colonoscope was                        passed under direct vision. Throughout the procedure,                        the patient's blood pressure, pulse, and oxygen                        saturations were monitored continuously. The                        Colonoscope was introduced through the anus and                        advanced to the the cecum, identified by appendiceal                        orifice and ileocecal valve. The colonoscopy was                        unusually difficult due to a tortuous colon. Successful                        completion of the procedure was aided by changing the                        patient to a prone position, using manual pressure and                        changing endoscopes. The patient tolerated the                        procedure well. The quality of the bowel preparation                        was fair. Findings:      Multiple small-mouthed diverticula were found in the sigmoid colon and  descending colon.      Non-bleeding internal hemorrhoids were found during anoscopy. The       hemorrhoids were small and Grade I (internal hemorrhoids that do not        prolapse).      The digital rectal exam was normal.      The colon (entire examined portion) was significantly tortuous.      A 2 mm polyp was found in the transverse colon. The polyp was sessile.       The polyp was removed with a cold biopsy forceps. Resection and       retrieval were complete.      A 2 mm polyp was found in the cecum. The polyp was sessile. The polyp       was removed with a cold biopsy forceps. Resection and retrieval were       complete. Impression:           - Preparation of the colon was fair.                       - Diverticulosis in the sigmoid colon and in the                        descending colon.                       - Non-bleeding internal hemorrhoids.                       - Tortuous colon.                       - No specimens collected. Recommendation:       - Use Citrucel one tablespoon PO daily daily.                       - Miralax 1 capful (17 grams) in 8 ounces of water PO                        daily.                       - Return to GI clinic in 1 month. Procedure Code(s):    --- Professional ---                       [phone], Colonoscopy, flexible; with biopsy, single or                        multiple Diagnosis Code(s):    --- Professional ---                       K64.0, First degree hemorrhoids                       R10.30, Lower abdominal pain, unspecified                       K62.5, Hemorrhage of anus and rectum                       K57.30, Diverticulosis of large intestine without  perforation or abscess without bleeding                       Q43.8, Other specified congenital malformations of                        intestine CPT copyright 2016 [employer]. All rights reserved. The codes documented in this report are preliminary and upon coder review may  be revised to meet current compliance requirements. Lollie Sails, MD 06/28/2017 2:03:28 PM This report has been signed  electronically. Number of Addenda: 0 Note Initiated On: 06/28/2017 1:05 PM Scope Withdrawal Time: 0 hours 12 minutes 2 seconds  Total Procedure Duration: 0 hours 35 minutes 11 seconds       [facility]

## 2017-06-29 ENCOUNTER — Encounter: Payer: Self-pay | Admitting: Gastroenterology

## 2017-06-30 LAB — SURGICAL PATHOLOGY

## 2017-07-29 DIAGNOSIS — I639 Cerebral infarction, unspecified: Secondary | ICD-10-CM

## 2017-07-29 HISTORY — DX: Cerebral infarction, unspecified: I63.9

## 2017-08-24 DIAGNOSIS — Z8673 Personal history of transient ischemic attack (TIA), and cerebral infarction without residual deficits: Secondary | ICD-10-CM

## 2017-09-10 ENCOUNTER — Encounter: Payer: Self-pay | Admitting: *Deleted

## 2017-09-10 ENCOUNTER — Ambulatory Visit
Admission: EM | Admit: 2017-09-10 | Discharge: 2017-09-10 | Disposition: A | Discharge status: Hospice | Payer: Medicare HMO | Attending: Emergency Medicine | Admitting: Emergency Medicine

## 2017-09-10 DIAGNOSIS — I4891 Unspecified atrial fibrillation: Secondary | ICD-10-CM | POA: Diagnosis not present

## 2017-09-10 DIAGNOSIS — I5032 Chronic diastolic (congestive) heart failure: Secondary | ICD-10-CM | POA: Insufficient documentation

## 2017-09-10 DIAGNOSIS — Z8673 Personal history of transient ischemic attack (TIA), and cerebral infarction without residual deficits: Secondary | ICD-10-CM | POA: Diagnosis not present

## 2017-09-10 DIAGNOSIS — Z7901 Long term (current) use of anticoagulants: Secondary | ICD-10-CM | POA: Insufficient documentation

## 2017-09-10 DIAGNOSIS — R55 Syncope and collapse: Secondary | ICD-10-CM | POA: Diagnosis not present

## 2017-09-10 DIAGNOSIS — Z79899 Other long term (current) drug therapy: Secondary | ICD-10-CM | POA: Insufficient documentation

## 2017-09-10 DIAGNOSIS — I110 Hypertensive heart disease with heart failure: Secondary | ICD-10-CM | POA: Diagnosis not present

## 2017-09-10 DIAGNOSIS — R110 Nausea: Secondary | ICD-10-CM | POA: Insufficient documentation

## 2017-09-10 MED ORDER — ONDANSETRON 8 MG PO TBDP
8.0000 mg | ORAL_TABLET | Freq: Once | ORAL | Status: AC
  Administered 2017-09-10: 8 mg via ORAL

## 2017-09-10 NOTE — Discharge Instructions (Signed)
No immediately to the [facility] emergency Department. Let them know if you start having chest pain, shortness of breath, if your headache returns, or for stroke like symptoms

## 2017-09-10 NOTE — ED Triage Notes (Signed)
Also, near syncopal episode yesterday.

## 2017-09-10 NOTE — ED Triage Notes (Signed)
CVA on 9/10 and has had nausea since. Worse today.

## 2017-09-10 NOTE — ED Provider Notes (Signed)
HPI  SUBJECTIVE:  Katelyn Lee is a 81 y.o. female who presents with nausea since her stroke on 07/29/2017. States it got acutely worse yesterday. States that she was unable to sleep last night due to the nausea. She reports a mild headache which has since resolved, overall weakness and states that she becomes easily fatigued. She reports decreased by mouth intake and decreased appetite due to the nausea. She reports an unknown amount of unintentional weight loss since August. She denies slurred speech, arm or leg weakness, blurry or double vision, dysarthria, aphasia, facial droop. No chest pain, palpitations. She reports some shortness of breath this morning. No diaphoresis, coughing, wheezing. No abdominal pain. She had a single episode of normal colored diarrhea yesterday. No urinary complaints. No back pain, lower extremity edema, change in her baseline dyspnea on exertion. No PND, nocturia, orthopnea. No recent change in her medications, she states that she was started on eliquis after the stroke and started having nausea shortly afterwards. States that she is only taking eliquis, Neurontin and Ambien. She has been in her otherwise usual state of health. She states that she is currently not on Florinef as it might have caused her hypertensive urgency that required an ED evaluation 2 weeks ago. No aggravating or alleviating factors. It is not associated with exertion, lying down, eating. She has not tried anything for this. She denies raw or uncooked foods, questionable leftovers or new foods.  Pt also reports a near syncopal episode yesterday while bending over. States that her vision went "completely black" but denies full LOC. This lasted several seconds and then resolved. She denies shortness of breath, palpitations, chest pain or diaphoresis with this episode. States that she has had similar episodes previously but they have not been as bad as yesterday's episode was.   Past medical history  includes A. Fib not on any rate control meds, HTN, orthostatic hypotension,  stroke on 07/29/2017, tubular adenoma, chronic diastolic heart failure with preserved EF of over 50%. Last colonoscopy 06/28/2017.  Cardiology note from 9/17- patient is on apixiban for anticoagulation not Coumadin. Patient has a new problem of hypotension associated with dyspnea fatigue irregular heartbeat near syncope, thought to be either due to her current medical regimen and/or dehydration.  PMD: Dr. Ellison Hughs.  Past Medical History:  Diagnosis Date  . A-fib ([place])   . Dyspnea   . Dysrhythmia    A-FIB  . Goiter   . Insomnia     Past Surgical History:  Procedure Laterality Date  . CHOLECYSTECTOMY    . COLONOSCOPY    . COLONOSCOPY WITH PROPOFOL N/A 06/28/2017   Procedure: COLONOSCOPY WITH PROPOFOL;  Surgeon: Lollie Sails, MD;  Location: [facility] ENDOSCOPY;  Service: Endoscopy;  Laterality: N/A;  . HERNIA REPAIR    . TONSILLECTOMY      History reviewed. No pertinent family history.  Social History  Substance Use Topics  . Smoking status: Never Smoker  . Smokeless tobacco: Never Used  . Alcohol use No    No current facility-administered medications for this encounter.   Current Outpatient Prescriptions:  .  apixaban (ELIQUIS) 5 MG TABS tablet, Take 5 mg by mouth 2 (two) times daily., Disp: , Rfl:  .  gabapentin (NEURONTIN) 300 MG capsule, Take 300 mg by mouth at bedtime., Disp: , Rfl:  .  ranitidine (ZANTAC) 75 MG tablet, Take 1 tablet (75 mg total) by mouth 2 (two) times daily., Disp: 60 tablet, Rfl: 0 .  zolpidem (AMBIEN) 5 MG tablet,  Take 5 mg by mouth at bedtime as needed for sleep., Disp: , Rfl:  .  fluticasone (FLONASE) 50 MCG/ACT nasal spray, Place 2 sprays into both nostrils daily., Disp: 16 g, Rfl: 0 .  meclizine (ANTIVERT) 12.5 MG tablet, Take 12.5 mg by mouth 3 (three) times daily as needed for dizziness., Disp: , Rfl:   Allergies  Allergen Reactions  . Coumadin [Warfarin Sodium]  Rash     ROS  As noted in HPI.   Physical Exam  BP 137/82 (BP Location: Left Arm)   Pulse 78   Temp 97.8 F (36.6 C) (Oral)   Resp 16   Ht 5\' 8"  (1.727 m)   Wt 151 lb (68.5 kg)   SpO2 100%   BMI 22.96 kg/m   Constitutional: Well developed, well nourished, no acute distress Eyes: PERRL, EOMI, conjunctiva normal bilaterally HENT: Normocephalic, atraumatic,mucus membranes moist Respiratory: Clear to auscultation bilaterally, no rales, no wheezing, no rhonchi Cardiovascular: Normal rate and rhythm, no murmurs, no gallops, no rubs GI: Soft, nondistended, normal bowel sounds, nontender, no rebound, no guarding Back: no CVAT skin: No rash, skin intact Musculoskeletal: No edema, no tenderness, no deformities Neurologic: Alert & oriented x 3, CN II-XII grossly intact, no motor deficits, sensation grossly intact Psychiatric: Speech and behavior appropriate   ED Course   Medications  ondansetron (ZOFRAN-ODT) disintegrating tablet 8 mg (8 mg Oral Given 09/10/17 0930)    Orders Placed This Encounter  Procedures  . ED EKG    nausea    Standing Status:   Standing    Number of Occurrences:   1    Order Specific Question:   Reason for Exam    Answer:   Other (See Comments)  . EKG 12-Lead    Standing Status:   Standing    Number of Occurrences:   1   No results found for this or any previous visit (from the past 24 hour(s)). No results found.  ED Clinical Impression  Nausea  Near syncope  ED Assessment/Plan   Previous outside charts reviewed- additional PMH obtained.. As noted in history of present illness.  Previous EKG report sinus rhythm with PVCs, LVH, prolonged QT, nonspecific ST elevation.Unable to see damage.  EKG: Normal sinus rhythm, left axis deviation. Normal intervals, LVH. No ST-T wave changes. No previous EKG hemorrhage for comparison.  Of note, her previous weight in August was 150 pounds, she was 151 today. She was given Zofran 8 mg here with  improvement in her nausea.  The differential for nausea is vast. It may be due to her medications, as a each cause nausea, but the combination itself per [facility] does not cause nausea. Malignancy in the ddx, or it could be central such as a side effect from her stroke or a mass. however I suspect that she is malnourished and dehydrated since this is been going on for some time. She states that it got acutely worse yesterday and I cannot pinpoint any particular reason why. Also concern for the near syncopal episode yesterday, again most likely from dehydration however cannot rule out cardiac etiology. Discussed with patient and family member that I think that she would benefit from a comprehensive workup in the ER to further identify the etiology of her nausea and to rule out serious causes of her presyncopal episode yesterday. She wishes to go to the [facility] emergency Department. Feel that she is stable to go by private vehicle. She agrees with plan.  Meds ordered this encounter  Medications  .  ondansetron (ZOFRAN-ODT) disintegrating tablet 8 mg  . apixaban (ELIQUIS) 5 MG TABS tablet    Sig: Take 5 mg by mouth 2 (two) times daily.    *This clinic note was created using Dragon dictation software. Therefore, there may be occasional mistakes despite careful proofreading.  ?   Melynda Ripple, MD 09/10/17 1036

## 2018-09-27 ENCOUNTER — Other Ambulatory Visit: Payer: Self-pay | Admitting: Gastroenterology

## 2018-09-27 DIAGNOSIS — R1310 Dysphagia, unspecified: Secondary | ICD-10-CM

## 2018-10-02 ENCOUNTER — Ambulatory Visit
Admission: RE | Admit: 2018-10-02 | Discharge: 2018-10-02 | Disposition: A | Discharge status: Home / Self Care | Payer: Medicare HMO | Source: Ambulatory Visit | Attending: Gastroenterology | Admitting: Gastroenterology

## 2018-10-02 DIAGNOSIS — R1314 Dysphagia, pharyngoesophageal phase: Secondary | ICD-10-CM | POA: Insufficient documentation

## 2018-10-02 DIAGNOSIS — K219 Gastro-esophageal reflux disease without esophagitis: Secondary | ICD-10-CM | POA: Diagnosis not present

## 2018-10-02 DIAGNOSIS — R252 Cramp and spasm: Secondary | ICD-10-CM | POA: Diagnosis not present

## 2018-10-02 DIAGNOSIS — R1310 Dysphagia, unspecified: Secondary | ICD-10-CM

## 2019-09-29 ENCOUNTER — Encounter: Payer: Self-pay | Admitting: Emergency Medicine

## 2019-09-29 ENCOUNTER — Ambulatory Visit
Admission: EM | Admit: 2019-09-29 | Discharge: 2019-09-29 | Disposition: A | Discharge status: Home / Self Care | Payer: Medicare Other

## 2019-09-29 ENCOUNTER — Other Ambulatory Visit: Payer: Self-pay

## 2019-09-29 ENCOUNTER — Ambulatory Visit (INDEPENDENT_AMBULATORY_CARE_PROVIDER_SITE_OTHER): Payer: Medicare Other

## 2019-09-29 DIAGNOSIS — W19XXXA Unspecified fall, initial encounter: Secondary | ICD-10-CM

## 2019-09-29 DIAGNOSIS — R52 Pain, unspecified: Secondary | ICD-10-CM

## 2019-09-29 DIAGNOSIS — S60222A Contusion of left hand, initial encounter: Secondary | ICD-10-CM | POA: Diagnosis not present

## 2019-09-29 DIAGNOSIS — H60501 Unspecified acute noninfective otitis externa, right ear: Secondary | ICD-10-CM | POA: Diagnosis not present

## 2019-09-29 DIAGNOSIS — H6121 Impacted cerumen, right ear: Secondary | ICD-10-CM

## 2019-09-29 DIAGNOSIS — M79642 Pain in left hand: Secondary | ICD-10-CM

## 2019-09-29 MED ORDER — NEOMYCIN-POLYMYXIN-HC 3.5-10000-1 OT SUSP: 4.0000 [drp] | Freq: Three times a day (TID) | OTIC | Status: DC

## 2019-09-29 NOTE — ED Provider Notes (Signed)
[place], [place]   Name: Katelyn Lee DOB: [birth year] MRN: [id number] CSN: [id number] PCP: Sofie Hartigan, MD  Arrival date and time:  09/29/19 1303  Chief Complaint:  Ear Problem (right) and Hand Injury (left)   NOTE: Prior to seeing the patient today, I have reviewed the triage nursing documentation and vital signs. Clinical staff has updated patient's PMH/PSHx, current medication list, and drug allergies/intolerances to ensure comprehensive history available to assist in medical decision making.   History:   HPI: Katelyn Lee is a 83 y.o. female who presents today with complaints of pain in her LEFT hand following a fall x 2 weeks ago. Patient reports that when she fell her fingers were "bent back". Since her fall, patient has had pain and swelling to the base of her second, third, and forth digits on her LEFT hand. Patient able to make a fist and move her fingers, however she notes that her fingers feel "tight". Patient denies previous hand injuries/surgeries. In efforts to conservatively manage her symptoms at home, the patient notes that she has used APAP, which has helped to improve her symptoms some. She states, "my family has been after me to get this hand checked out".   Additionally, patient was seen by her audiologist this week for issues with her hearing aid. During her assessment, patient was found to have impacted cerumen in her RIGHT ear contributing to her decreased hearing. She was advised to come in to be seen to have her ears cleaned out. Patient reports a dull ache in her RIGHT ear and intermittent clear drainage.   Past Medical History:  Diagnosis Date  . A-fib ([place])   . Dyspnea   . Dysrhythmia    A-FIB  . Goiter   . Insomnia     Past Surgical History:  Procedure Laterality Date  . CHOLECYSTECTOMY    . COLONOSCOPY    . COLONOSCOPY WITH PROPOFOL N/A 06/28/2017   Procedure: COLONOSCOPY WITH PROPOFOL;  Surgeon: Lollie Sails, MD;  Location[facility]  ENDOSCOPY;  Service: Endoscopy;  Laterality: N/A;  . HERNIA REPAIR    . TONSILLECTOMY      Family History  Problem Relation Age of Onset  . Depression Mother   . Heart disease Father     Social History   Tobacco Use  . Smoking status: Never Smoker  . Smokeless tobacco: Never Used  Substance Use Topics  . Alcohol use: No  . Drug use: No    There are no active problems to display for this patient.   Home Medications:    Current Meds  Medication Sig  . fluticasone (FLONASE) 50 MCG/ACT nasal spray Place 2 sprays into both nostrils daily.  . midodrine (PROAMATINE) 2.5 MG tablet Take by mouth.  . rivaroxaban (XARELTO) 20 MG TABS tablet Take by mouth.  . sertraline (ZOLOFT) 100 MG tablet Take 1.5 tablets by mouth daily.    Allergies:   Coumadin [warfarin sodium]  Review of Systems (ROS): Review of Systems  Constitutional: Negative for chills and fever.  HENT: Positive for ear pain. Negative for congestion, ear discharge, rhinorrhea, sinus pressure, sinus pain and sore throat.   Respiratory: Negative for cough and shortness of breath.   Cardiovascular: Negative for chest pain and palpitations.  Musculoskeletal:       LEFT hand pain  All other systems reviewed and are negative.    Vital Signs: Today's Vitals   09/29/19 1318 09/29/19 1319 09/29/19 1418  BP:  (!) 166/80  Pulse:  (!) 57   Resp:  18   Temp:  98.2 F (36.8 C)   TempSrc:  Oral   SpO2:  100%   Weight: 151 lb (68.5 kg)    Height: 5' 8.5" (1.74 m)    PainSc: 1   1     Physical Exam: Physical Exam  Constitutional: She is oriented to person, place, and time and well-developed, well-nourished, and in no distress.  HENT:  Head: Normocephalic and atraumatic.  Right Ear: There is drainage (clear) and tenderness. No swelling. Decreased hearing is noted.  Left Ear: Tympanic membrane, external ear and ear canal normal.  Mouth/Throat: Mucous membranes are normal.  RIGHT ear with EAU occluded by cerumen;  unable to visualize TM (pre-procedural).   Eyes: Pupils are equal, round, and reactive to light. EOM are normal.  Neck: Normal range of motion. Neck supple. No tracheal deviation present.  Cardiovascular: Normal rate.  Pulmonary/Chest: Effort normal and breath sounds normal. No respiratory distress.  Musculoskeletal:     Left hand: She exhibits decreased range of motion, tenderness and swelling. She exhibits normal two-point discrimination, normal capillary refill and no deformity. Normal sensation noted. Normal strength noted.       Hands:  Neurological: She is alert and oriented to person, place, and time. Gait normal.  Skin: Skin is warm and dry. No rash noted.  Psychiatric: Mood, memory, affect and judgment normal.  Nursing note and vitals reviewed.   Urgent Care Treatments / Results:   LABS: PLEASE NOTE: all labs that were ordered this encounter are listed, however only abnormal results are displayed. Labs Reviewed - No data to display  EKG: -None  RADIOLOGY: Dg Hand Complete Left  Result Date: 09/29/2019 CLINICAL DATA:  Patient reports fall onto outstretched hands 2 weeks ago. Reports pain to 2nd-5th MCP joints of left hand. Patient reports her fingers were hyperextended in fall. EXAM: LEFT HAND - COMPLETE 3+ VIEW COMPARISON:  None. FINDINGS: A ring on the fourth digit limits evaluation of the fourth proximal phalanx. Diffuse osteopenia. No evidence of fracture or dislocation. Severe degenerative changes at the DIP and PIP joints. Carpal crowding. IMPRESSION: No acute osseous abnormality in the left hand. Electronically Signed   By: Audie Pinto M.D.   On: 09/29/2019 14:01    PROCEDURES: Ear Cerumen Removal Performed by: Karen Kitchens, NP Authorized by: Karen Kitchens, NP   Consent:    Consent obtained:  Verbal   Consent given by:  Patient   Risks discussed:  Bleeding, infection, pain, dizziness, incomplete removal and TM perforation   Alternatives discussed:   Delayed treatment, alternative treatment and referral Procedure details:    Location:  R ear   Procedure type comment:  Warm water lavage followed by currette disimpaction Post-procedure details:    Inspection:  Bleeding, macerated skin and TM intact   Hearing quality:  Improved   Patient tolerance of procedure:  Tolerated well, no immediate complications    MEDICATIONS RECEIVED THIS VISIT: Medications - No data to display  PERTINENT CLINICAL COURSE NOTES/UPDATES:   Initial Impression / Assessment and Plan / Urgent Care Course:  Pertinent labs & imaging results that were available during my care of the patient were personally reviewed by me and considered in my medical decision making (see lab/imaging section of note for values and interpretations).  EASTYNN MATOS is a 83 y.o. female who presents to [facility] today with complaints of Ear Problem (right) and Hand Injury (left)   Patient  is well appearing overall in clinic today. She does not appear to be in any acute distress. Presenting symptoms (see HPI) and exam as documented above.  Patient with pain overlying the second through fourth MCP joints of her hand following a fall whereby her digits were hyperextended.  Diagnostic radiographs of the hand revealed no evidence of fracture or dislocation.  There was severe degenerative changes in the DIP and PIP joints, however no acute osseous abnormality.  Patient encouraged to continue conservative care using APAP, ice, and elevation.  If pain persist, patient to follow-up with her primary care physician to consider referral to orthopedics.  Patient advised to ensure daily range of motion exercises; patient shown exercises in clinic by provider with return demonstration.  Cerumen impaction to RIGHT ear removed as per above procedure note.  Postprocedural inspection reveals significant bleeding and tissue maceration.  Will cover for otitis externa using a 5-day course of Cortisporin otic  drops.  Patient advised that bleeding and discomfort may occur over the next couple of days, however the prescribed drops (antibiotic/steroids) should help.  Patient may use APAP on a PRN basis to help with her discomfort.  Discussed follow up with primary care physician in 1 week for re-evaluation. I have reviewed the follow up and strict return precautions for any new or worsening symptoms. Patient is aware of symptoms that would be deemed urgent/emergent, and would thus require further evaluation either here or in the emergency department. At the time of discharge, she verbalized understanding and consent with the discharge plan as it was reviewed with her. All questions were fielded by provider and/or clinic staff prior to patient discharge.    Final Clinical Impressions / Urgent Care Diagnoses:   Final diagnoses:  Contusion of left hand, initial encounter  Fall, initial encounter  Impacted cerumen of right ear  Acute otitis externa of right ear, unspecified type    New Prescriptions:  [place] Controlled Substance Registry consulted? Not Applicable  Meds ordered this encounter  Medications  . neomycin-polymyxin-hydrocortisone (CORTISPORIN) 3.5-10000-1 OTIC suspension    Sig: Place 4 drops into the right ear 3 (three) times daily. for the next 5 days    Dispense:  10 mL    Refill:  00    Recommended Follow up Care:  Patient encouraged to follow up with the following provider within the specified time frame, or sooner as dictated by the severity of her symptoms. As always, she was instructed that for any urgent/emergent care needs, she should seek care either here or in the emergency department for more immediate evaluation.  Follow-up Information    Feldpausch, Chrissie Noa, MD In 1 week.   Specialty: Family Medicine Why: General reassessment of symptoms if not improving Contact information: [place] [id number] [phone]         NOTE: This note was prepared using  Dragon dictation software along with smaller phrase technology. Despite my best ability to proofread, there is the potential that transcriptional errors may still occur from this process, and are completely unintentional.    Karen Kitchens, NP 09/30/19 1640

## 2019-09-29 NOTE — Discharge Instructions (Signed)
It was very nice seeing you today in clinic. Thank you for entrusting me with your care.   XRAY was normal. No concerns for fracture. Continue to use Tylenol as needed for pain.   Ear was disimpacted. The ear appears infected under the wax. There is bleeding in the canal, which is normal. Use the drops that I am sending in to help with pain, infection, and swelling.   Make arrangements to follow up with your regular doctor in 1 week for re-evaluation if not improving. If your symptoms/condition worsens, please seek follow up care either here or in the ER. Please remember, our [place] providers are "right here with you" when you need [place].   Again, it was my pleasure to take care of you today. Thank you for choosing our clinic. I hope that you start to feel better quickly.   Honor Loh, MSN, APRN, FNP-C, CEN Advanced Practice Provider [facility]

## 2019-09-29 NOTE — ED Triage Notes (Signed)
Patient in today c/o ear wax buildup x 1 week. Patient went to get her hearing aid cleaned, but they couldn't clean the wax out of her ear.  Patient also c/o left hand/finger pain x 2 weeks. Patient states that she fell and caught herself on the fingers of her left hand and bent them back.

## 2020-01-14 ENCOUNTER — Other Ambulatory Visit: Payer: Self-pay

## 2020-01-14 ENCOUNTER — Encounter: Payer: Self-pay | Admitting: Ophthalmology

## 2020-01-17 ENCOUNTER — Other Ambulatory Visit
Admission: RE | Admit: 2020-01-17 | Discharge: 2020-01-17 | Disposition: A | Discharge status: Home / Self Care | Payer: Medicare Other | Source: Ambulatory Visit | Attending: Ophthalmology | Admitting: Ophthalmology

## 2020-01-17 ENCOUNTER — Other Ambulatory Visit: Payer: Self-pay

## 2020-01-17 DIAGNOSIS — Z20822 Contact with and (suspected) exposure to covid-19: Secondary | ICD-10-CM | POA: Diagnosis not present

## 2020-01-17 DIAGNOSIS — Z01812 Encounter for preprocedural laboratory examination: Secondary | ICD-10-CM | POA: Diagnosis present

## 2020-01-17 LAB — SARS CORONAVIRUS 2 (TAT 6-24 HRS): SARS Coronavirus 2: NEGATIVE

## 2020-01-17 NOTE — Discharge Instructions (Signed)

## 2020-01-21 ENCOUNTER — Ambulatory Visit: Payer: Medicare Other | Admitting: Anesthesiology

## 2020-01-21 ENCOUNTER — Other Ambulatory Visit: Payer: Self-pay

## 2020-01-21 ENCOUNTER — Encounter: Payer: Self-pay | Admitting: Ophthalmology

## 2020-01-21 ENCOUNTER — Encounter
Admission: RE | Disposition: A | Discharge status: Home / Self Care | Payer: Self-pay | Source: Home / Self Care | Attending: Ophthalmology

## 2020-01-21 ENCOUNTER — Ambulatory Visit
Admission: RE | Admit: 2020-01-21 | Discharge: 2020-01-21 | Disposition: A | Discharge status: Home / Self Care | Payer: Medicare Other | Attending: Ophthalmology | Admitting: Ophthalmology

## 2020-01-21 DIAGNOSIS — Z8673 Personal history of transient ischemic attack (TIA), and cerebral infarction without residual deficits: Secondary | ICD-10-CM | POA: Insufficient documentation

## 2020-01-21 DIAGNOSIS — R42 Dizziness and giddiness: Secondary | ICD-10-CM | POA: Diagnosis not present

## 2020-01-21 DIAGNOSIS — M1990 Unspecified osteoarthritis, unspecified site: Secondary | ICD-10-CM | POA: Diagnosis not present

## 2020-01-21 DIAGNOSIS — Z888 Allergy status to other drugs, medicaments and biological substances status: Secondary | ICD-10-CM | POA: Insufficient documentation

## 2020-01-21 DIAGNOSIS — K219 Gastro-esophageal reflux disease without esophagitis: Secondary | ICD-10-CM | POA: Insufficient documentation

## 2020-01-21 DIAGNOSIS — Z85828 Personal history of other malignant neoplasm of skin: Secondary | ICD-10-CM | POA: Diagnosis not present

## 2020-01-21 DIAGNOSIS — H2512 Age-related nuclear cataract, left eye: Secondary | ICD-10-CM | POA: Insufficient documentation

## 2020-01-21 DIAGNOSIS — I4891 Unspecified atrial fibrillation: Secondary | ICD-10-CM | POA: Insufficient documentation

## 2020-01-21 DIAGNOSIS — Z9049 Acquired absence of other specified parts of digestive tract: Secondary | ICD-10-CM | POA: Insufficient documentation

## 2020-01-21 DIAGNOSIS — I951 Orthostatic hypotension: Secondary | ICD-10-CM | POA: Insufficient documentation

## 2020-01-21 DIAGNOSIS — F419 Anxiety disorder, unspecified: Secondary | ICD-10-CM | POA: Insufficient documentation

## 2020-01-21 HISTORY — DX: Unspecified osteoarthritis, unspecified site: M19.90

## 2020-01-21 HISTORY — DX: Dizziness and giddiness: R42

## 2020-01-21 HISTORY — DX: Presence of dental prosthetic device (complete) (partial): Z97.2

## 2020-01-21 HISTORY — DX: Presence of external hearing-aid: Z97.4

## 2020-01-21 HISTORY — PX: CATARACT EXTRACTION W/PHACO: SHX586

## 2020-01-21 SURGERY — PHACOEMULSIFICATION, CATARACT, WITH IOL INSERTION
Anesthesia: Monitor Anesthesia Care | Site: Eye | Laterality: Left

## 2020-01-21 MED ORDER — ARMC OPHTHALMIC DILATING DROPS
1.0000 "application " | OPHTHALMIC | Status: DC | PRN
  Administered 2020-01-21 (×3): 1 via OPHTHALMIC

## 2020-01-21 MED ORDER — TETRACAINE HCL 0.5 % OP SOLN
1.0000 [drp] | OPHTHALMIC | Status: DC | PRN
  Administered 2020-01-21 (×3): 1 [drp] via OPHTHALMIC

## 2020-01-21 MED ORDER — LACTATED RINGERS IV SOLN: 10.0000 mL/h | INTRAVENOUS | Status: DC

## 2020-01-21 MED ORDER — FENTANYL CITRATE (PF) 100 MCG/2ML IJ SOLN
INTRAMUSCULAR | Status: DC | PRN
  Administered 2020-01-21: 50 ug via INTRAVENOUS

## 2020-01-21 MED ORDER — LIDOCAINE HCL (PF) 2 % IJ SOLN
INTRAOCULAR | Status: DC | PRN
  Administered 2020-01-21: 1 mL via INTRAOCULAR

## 2020-01-21 MED ORDER — ACETAMINOPHEN 160 MG/5ML PO SOLN: 325.0000 mg | Freq: Once | ORAL | Status: DC

## 2020-01-21 MED ORDER — EPINEPHRINE PF 1 MG/ML IJ SOLN
INTRAOCULAR | Status: DC | PRN
  Administered 2020-01-21: 147 mL via OPHTHALMIC

## 2020-01-21 MED ORDER — ACETAMINOPHEN 325 MG PO TABS: 325.0000 mg | ORAL_TABLET | Freq: Once | ORAL | Status: DC

## 2020-01-21 MED ORDER — SODIUM HYALURONATE 10 MG/ML IO SOLN
INTRAOCULAR | Status: DC | PRN
  Administered 2020-01-21: 0.55 mL via INTRAOCULAR

## 2020-01-21 MED ORDER — MOXIFLOXACIN HCL 0.5 % OP SOLN
OPHTHALMIC | Status: DC | PRN
  Administered 2020-01-21: 0.2 mL via OPHTHALMIC

## 2020-01-21 MED ORDER — SODIUM HYALURONATE 23 MG/ML IO SOLN
INTRAOCULAR | Status: DC | PRN
  Administered 2020-01-21: 0.6 mL via INTRAOCULAR

## 2020-01-21 MED ORDER — MIDAZOLAM HCL 2 MG/2ML IJ SOLN
INTRAMUSCULAR | Status: DC | PRN
  Administered 2020-01-21: 1 mg via INTRAVENOUS

## 2020-01-21 SURGICAL SUPPLY — 19 items
CANNULA ANT/CHMB 27G (MISCELLANEOUS) ×2 IMPLANT
CANNULA ANT/CHMB 27GA (MISCELLANEOUS) ×6 IMPLANT
DISSECTOR HYDRO NUCLEUS 50X22 (MISCELLANEOUS) ×3 IMPLANT
GLOVE SURG LX 7.5 STRW (GLOVE) ×2
GLOVE SURG LX STRL 7.5 STRW (GLOVE) ×1 IMPLANT
GLOVE SURG SYN 8.5  E (GLOVE) ×2
GLOVE SURG SYN 8.5 E (GLOVE) ×1 IMPLANT
GLOVE SURG SYN 8.5 PF PI (GLOVE) ×1 IMPLANT
GOWN STRL REUS W/ TWL LRG LVL3 (GOWN DISPOSABLE) ×2 IMPLANT
GOWN STRL REUS W/TWL LRG LVL3 (GOWN DISPOSABLE) ×4
LENS IOL TECNIS ITEC 24.0 (Intraocular Lens) ×2 IMPLANT
MARKER SKIN DUAL TIP RULER LAB (MISCELLANEOUS) ×3 IMPLANT
PACK DR. KING ARMS (PACKS) ×3 IMPLANT
PACK EYE AFTER SURG (MISCELLANEOUS) ×3 IMPLANT
PACK OPTHALMIC (MISCELLANEOUS) ×3 IMPLANT
SYR 3ML LL SCALE MARK (SYRINGE) ×3 IMPLANT
SYR TB 1ML LUER SLIP (SYRINGE) ×3 IMPLANT
WATER STERILE IRR 250ML POUR (IV SOLUTION) ×3 IMPLANT
WIPE NON LINTING 3.25X3.25 (MISCELLANEOUS) ×3 IMPLANT

## 2020-01-21 NOTE — Anesthesia Preprocedure Evaluation (Addendum)
Anesthesia Evaluation  Patient identified by MRN, date of birth, ID band Patient awake    Reviewed: Allergy & Precautions, H&P , NPO status , Patient's Chart, lab work & pertinent test results  Airway Mallampati: II  TM Distance: >3 FB Neck ROM: full    Dental  (+) Upper Dentures, Lower Dentures   Pulmonary    Pulmonary exam normal breath sounds clear to auscultation       Cardiovascular + dysrhythmias Atrial Fibrillation  Rhythm:regular Rate:Normal     Neuro/Psych CVA    GI/Hepatic   Endo/Other    Renal/GU      Musculoskeletal   Abdominal   Peds  Hematology   Anesthesia Other Findings   Reproductive/Obstetrics                             Anesthesia Physical Anesthesia Plan  ASA: III  Anesthesia Plan: MAC   Post-op Pain Management:    Induction:   PONV Risk Score and Plan: 2 and Treatment may vary due to age or medical condition, TIVA and Midazolam  Airway Management Planned:   Additional Equipment:   Intra-op Plan:   Post-operative Plan:   Informed Consent: I have reviewed the patients History and Physical, chart, labs and discussed the procedure including the risks, benefits and alternatives for the proposed anesthesia with the patient or authorized representative who has indicated his/her understanding and acceptance.     Dental Advisory Given  Plan Discussed with: CRNA  Anesthesia Plan Comments:        Anesthesia Quick Evaluation

## 2020-01-21 NOTE — H&P (Signed)

## 2020-01-21 NOTE — Anesthesia Procedure Notes (Signed)
Procedure Name: [place] Performed by: Cameron Ali, CRNA Pre-anesthesia Checklist: Patient identified, Emergency Drugs available, Suction available, Timeout performed and Patient being monitored Patient Re-evaluated:Patient Re-evaluated prior to induction Oxygen Delivery Method: Nasal cannula Placement Confirmation: positive ETCO2

## 2020-01-21 NOTE — Transfer of Care (Signed)
Immediate Anesthesia Transfer of Care Note  Patient: Katelyn Lee  Procedure(s) Performed: CATARACT EXTRACTION PHACO AND INTRAOCULAR LENS PLACEMENT (IOC) LEFT 8.97  00:57.9 (Left Eye)  Patient Location: PACU  Anesthesia Type: MAC  Level of Consciousness: awake, alert  and patient cooperative  Airway and Oxygen Therapy: Patient Spontanous Breathing and Patient connected to supplemental oxygen  Post-op Assessment: Post-op Vital signs reviewed, Patient's Cardiovascular Status Stable, Respiratory Function Stable, Patent Airway and No signs of Nausea or vomiting  Post-op Vital Signs: Reviewed and stable  Complications: No apparent anesthesia complications

## 2020-01-21 NOTE — Anesthesia Postprocedure Evaluation (Signed)
Anesthesia Post Note  Patient: Katelyn Lee  Procedure(s) Performed: CATARACT EXTRACTION PHACO AND INTRAOCULAR LENS PLACEMENT (IOC) LEFT 8.97  00:57.9 (Left Eye)     Patient location during evaluation: PACU Anesthesia Type: MAC Level of consciousness: awake and alert and oriented Pain management: satisfactory to patient Vital Signs Assessment: post-procedure vital signs reviewed and stable Respiratory status: spontaneous breathing, nonlabored ventilation and respiratory function stable Cardiovascular status: blood pressure returned to baseline and stable Postop Assessment: Adequate PO intake and No signs of nausea or vomiting Anesthetic complications: no    Raliegh Ip

## 2020-01-21 NOTE — Op Note (Signed)
OPERATIVE NOTE  ALEVIA HANAWAY [id number] 01/21/2020   PREOPERATIVE DIAGNOSIS:  Nuclear sclerotic cataract left eye.  H25.12   POSTOPERATIVE DIAGNOSIS:    Nuclear sclerotic cataract left eye.     PROCEDURE:  Phacoemusification with posterior chamber intraocular lens placement of the left eye   LENS:   Implant Name Type Inv. Item Serial No. Manufacturer Lot No. LRB No. Used Action  LENS IOL DIOP 24.0 - [id number] Intraocular Lens LENS IOL DIOP 24.0 [id number]  Left 1 Implanted      Procedure(s): CATARACT EXTRACTION PHACO AND INTRAOCULAR LENS PLACEMENT (IOC) LEFT 8.97  00:57.9 (Left)  PCB00 +24.0   ULTRASOUND TIME: 0 minutes 57 seconds.  CDE 8.97   SURGEON:  Benay Pillow, MD, MPH   ANESTHESIA:  Topical with tetracaine drops augmented with 1% preservative-free intracameral lidocaine.  ESTIMATED BLOOD LOSS: <1 mL   COMPLICATIONS:  None.   DESCRIPTION OF PROCEDURE:  The patient was identified in the holding room and transported to the operating room and placed in the supine position under the operating microscope.  The left eye was identified as the operative eye and it was prepped and draped in the usual sterile ophthalmic fashion.   A 1.0 millimeter clear-corneal paracentesis was made at the 5:00 position. 0.5 ml of preservative-free 1% lidocaine with epinephrine was injected into the anterior chamber.  The anterior chamber was filled with Healon 5 viscoelastic.  A 2.4 millimeter keratome was used to make a near-clear corneal incision at the 2:00 position.  A curvilinear capsulorrhexis was made with a cystotome and capsulorrhexis forceps.  Balanced salt solution was used to hydrodissect and hydrodelineate the nucleus.   Phacoemulsification was then used in stop and chop fashion to remove the lens nucleus and epinucleus.  The remaining cortex was then removed using the irrigation and aspiration handpiece. Healon was then placed into the capsular bag to distend it for lens placement.   A lens was then injected into the capsular bag.  The remaining viscoelastic was aspirated.   Wounds were hydrated with balanced salt solution.  The anterior chamber was inflated to a physiologic pressure with balanced salt solution.  Intracameral vigamox 0.1 mL undiltued was injected into the eye and a drop placed onto the ocular surface.  No wound leaks were noted.  The patient was taken to the recovery room in stable condition without complications of anesthesia or surgery  Benay Pillow 01/21/2020, 7:53 AM

## 2020-01-22 ENCOUNTER — Encounter: Payer: Self-pay | Admitting: *Deleted

## 2020-03-04 ENCOUNTER — Other Ambulatory Visit: Payer: Self-pay

## 2020-03-04 ENCOUNTER — Encounter: Payer: Self-pay | Admitting: Ophthalmology

## 2020-03-06 ENCOUNTER — Other Ambulatory Visit
Admission: RE | Admit: 2020-03-06 | Discharge: 2020-03-06 | Disposition: A | Discharge status: Home / Self Care | Payer: Medicare Other | Source: Ambulatory Visit | Attending: Ophthalmology | Admitting: Ophthalmology

## 2020-03-06 ENCOUNTER — Other Ambulatory Visit: Payer: Self-pay

## 2020-03-06 DIAGNOSIS — Z01812 Encounter for preprocedural laboratory examination: Secondary | ICD-10-CM | POA: Insufficient documentation

## 2020-03-06 DIAGNOSIS — Z20822 Contact with and (suspected) exposure to covid-19: Secondary | ICD-10-CM | POA: Insufficient documentation

## 2020-03-06 LAB — SARS CORONAVIRUS 2 (TAT 6-24 HRS): SARS Coronavirus 2: NEGATIVE

## 2020-03-06 NOTE — Discharge Instructions (Signed)

## 2020-03-10 ENCOUNTER — Ambulatory Visit: Payer: Medicare Other | Admitting: Anesthesiology

## 2020-03-10 ENCOUNTER — Other Ambulatory Visit: Payer: Self-pay

## 2020-03-10 ENCOUNTER — Ambulatory Visit
Admission: RE | Admit: 2020-03-10 | Discharge: 2020-03-10 | Disposition: A | Discharge status: Home / Self Care | Payer: Medicare Other | Attending: Ophthalmology | Admitting: Ophthalmology

## 2020-03-10 ENCOUNTER — Encounter: Payer: Self-pay | Admitting: Ophthalmology

## 2020-03-10 ENCOUNTER — Encounter
Admission: RE | Disposition: A | Discharge status: Home / Self Care | Payer: Self-pay | Source: Home / Self Care | Attending: Ophthalmology

## 2020-03-10 DIAGNOSIS — Z888 Allergy status to other drugs, medicaments and biological substances status: Secondary | ICD-10-CM | POA: Diagnosis not present

## 2020-03-10 DIAGNOSIS — Z8673 Personal history of transient ischemic attack (TIA), and cerebral infarction without residual deficits: Secondary | ICD-10-CM | POA: Diagnosis not present

## 2020-03-10 DIAGNOSIS — F419 Anxiety disorder, unspecified: Secondary | ICD-10-CM | POA: Insufficient documentation

## 2020-03-10 DIAGNOSIS — R42 Dizziness and giddiness: Secondary | ICD-10-CM | POA: Insufficient documentation

## 2020-03-10 DIAGNOSIS — M1990 Unspecified osteoarthritis, unspecified site: Secondary | ICD-10-CM | POA: Insufficient documentation

## 2020-03-10 DIAGNOSIS — Z9842 Cataract extraction status, left eye: Secondary | ICD-10-CM | POA: Insufficient documentation

## 2020-03-10 DIAGNOSIS — K219 Gastro-esophageal reflux disease without esophagitis: Secondary | ICD-10-CM | POA: Insufficient documentation

## 2020-03-10 DIAGNOSIS — Z9049 Acquired absence of other specified parts of digestive tract: Secondary | ICD-10-CM | POA: Diagnosis not present

## 2020-03-10 DIAGNOSIS — I4891 Unspecified atrial fibrillation: Secondary | ICD-10-CM | POA: Diagnosis not present

## 2020-03-10 DIAGNOSIS — Z85828 Personal history of other malignant neoplasm of skin: Secondary | ICD-10-CM | POA: Diagnosis not present

## 2020-03-10 DIAGNOSIS — H2511 Age-related nuclear cataract, right eye: Secondary | ICD-10-CM | POA: Diagnosis not present

## 2020-03-10 DIAGNOSIS — I951 Orthostatic hypotension: Secondary | ICD-10-CM | POA: Insufficient documentation

## 2020-03-10 DIAGNOSIS — R0602 Shortness of breath: Secondary | ICD-10-CM | POA: Diagnosis not present

## 2020-03-10 HISTORY — PX: CATARACT EXTRACTION W/PHACO: SHX586

## 2020-03-10 SURGERY — PHACOEMULSIFICATION, CATARACT, WITH IOL INSERTION
Anesthesia: Monitor Anesthesia Care | Site: Eye | Laterality: Right

## 2020-03-10 MED ORDER — LIDOCAINE HCL (PF) 2 % IJ SOLN
INTRAOCULAR | Status: DC | PRN
  Administered 2020-03-10: 1 mL via INTRAOCULAR

## 2020-03-10 MED ORDER — ACETAMINOPHEN 160 MG/5ML PO SOLN: 325.0000 mg | ORAL | Status: DC | PRN

## 2020-03-10 MED ORDER — TETRACAINE HCL 0.5 % OP SOLN
1.0000 [drp] | OPHTHALMIC | Status: DC | PRN
  Administered 2020-03-10 (×3): 1 [drp] via OPHTHALMIC

## 2020-03-10 MED ORDER — SODIUM HYALURONATE 10 MG/ML IO SOLN
INTRAOCULAR | Status: DC | PRN
  Administered 2020-03-10: 0.55 mL via INTRAOCULAR

## 2020-03-10 MED ORDER — FENTANYL CITRATE (PF) 100 MCG/2ML IJ SOLN
INTRAMUSCULAR | Status: DC | PRN
  Administered 2020-03-10: 50 ug via INTRAVENOUS

## 2020-03-10 MED ORDER — ACETAMINOPHEN 325 MG PO TABS: 325.0000 mg | ORAL_TABLET | ORAL | Status: DC | PRN

## 2020-03-10 MED ORDER — MIDAZOLAM HCL 2 MG/2ML IJ SOLN
INTRAMUSCULAR | Status: DC | PRN
  Administered 2020-03-10: 1 mg via INTRAVENOUS

## 2020-03-10 MED ORDER — MOXIFLOXACIN HCL 0.5 % OP SOLN
OPHTHALMIC | Status: DC | PRN
  Administered 2020-03-10: 0.2 mL via OPHTHALMIC

## 2020-03-10 MED ORDER — SODIUM HYALURONATE 23 MG/ML IO SOLN
INTRAOCULAR | Status: DC | PRN
  Administered 2020-03-10: 0.6 mL via INTRAOCULAR

## 2020-03-10 MED ORDER — EPINEPHRINE PF 1 MG/ML IJ SOLN
INTRAOCULAR | Status: DC | PRN
  Administered 2020-03-10: 111 mL via OPHTHALMIC

## 2020-03-10 MED ORDER — ARMC OPHTHALMIC DILATING DROPS
1.0000 "application " | OPHTHALMIC | Status: DC | PRN
  Administered 2020-03-10 (×3): 1 via OPHTHALMIC

## 2020-03-10 SURGICAL SUPPLY — 20 items
CANNULA ANT/CHMB 27G (MISCELLANEOUS) ×2 IMPLANT
CANNULA ANT/CHMB 27GA (MISCELLANEOUS) ×6 IMPLANT
DISSECTOR HYDRO NUCLEUS 50X22 (MISCELLANEOUS) ×3 IMPLANT
GLOVE SURG LX 7.5 STRW (GLOVE) ×2
GLOVE SURG LX STRL 7.5 STRW (GLOVE) ×1 IMPLANT
GLOVE SURG SYN 8.5  E (GLOVE) ×2
GLOVE SURG SYN 8.5 E (GLOVE) ×1 IMPLANT
GLOVE SURG SYN 8.5 PF PI (GLOVE) ×1 IMPLANT
GOWN STRL REUS W/ TWL LRG LVL3 (GOWN DISPOSABLE) ×2 IMPLANT
GOWN STRL REUS W/TWL LRG LVL3 (GOWN DISPOSABLE) ×4
LENS IOL DIOP 23.0 (Intraocular Lens) ×3 IMPLANT
LENS IOL TECNIS MONO 23.0 (Intraocular Lens) IMPLANT
MARKER SKIN DUAL TIP RULER LAB (MISCELLANEOUS) ×3 IMPLANT
PACK DR. KING ARMS (PACKS) ×3 IMPLANT
PACK EYE AFTER SURG (MISCELLANEOUS) ×3 IMPLANT
PACK OPTHALMIC (MISCELLANEOUS) ×3 IMPLANT
SYR 3ML LL SCALE MARK (SYRINGE) ×3 IMPLANT
SYR TB 1ML LUER SLIP (SYRINGE) ×3 IMPLANT
WATER STERILE IRR 250ML POUR (IV SOLUTION) ×3 IMPLANT
WIPE NON LINTING 3.25X3.25 (MISCELLANEOUS) ×3 IMPLANT

## 2020-03-10 NOTE — Op Note (Signed)
OPERATIVE NOTE  Katelyn Lee [id number] 03/10/2020   PREOPERATIVE DIAGNOSIS:  Nuclear sclerotic cataract right eye.  H25.11   POSTOPERATIVE DIAGNOSIS:    Nuclear sclerotic cataract right eye.     PROCEDURE:  Phacoemusification with posterior chamber intraocular lens placement of the right eye   LENS:   Implant Name Type Inv. Item Serial No. Manufacturer Lot No. LRB No. Used Action  LENS IOL DIOP 23.0 - [id number] Intraocular Lens LENS IOL DIOP 23.0 [id number] AMO  Right 1 Implanted       Procedure(s): CATARACT EXTRACTION PHACO AND INTRAOCULAR LENS PLACEMENT (IOC) RIGHT 11.55  01:10.2 (Right)  DCB00 +23.0   ULTRASOUND TIME: 1 minutes 10 seconds.  CDE 11.55   SURGEON:  Benay Pillow, MD, MPH  ANESTHESIOLOGIST: Anesthesiologist: Elgie Collard, MD CRNA: Georga Bora, CRNA   ANESTHESIA:  Topical with tetracaine drops augmented with 1% preservative-free intracameral lidocaine.  ESTIMATED BLOOD LOSS: less than 1 mL.   COMPLICATIONS:  None.   DESCRIPTION OF PROCEDURE:  The patient was identified in the holding room and transported to the operating room and placed in the supine position under the operating microscope.  The right eye was identified as the operative eye and it was prepped and draped in the usual sterile ophthalmic fashion.   A 1.0 millimeter clear-corneal paracentesis was made at the 10:30 position. 0.5 ml of preservative-free 1% lidocaine with epinephrine was injected into the anterior chamber.  The anterior chamber was filled with Healon 5 viscoelastic.  A 2.4 millimeter keratome was used to make a near-clear corneal incision at the 8:00 position.  A curvilinear capsulorrhexis was made with a cystotome and capsulorrhexis forceps.  Balanced salt solution was used to hydrodissect and hydrodelineate the nucleus.   Phacoemulsification was then used in stop and chop fashion to remove the lens nucleus and epinucleus.  The remaining cortex was then removed using the  irrigation and aspiration handpiece. Healon was then placed into the capsular bag to distend it for lens placement.  A lens was then injected into the capsular bag.  The remaining viscoelastic was aspirated.   Wounds were hydrated with balanced salt solution.  The anterior chamber was inflated to a physiologic pressure with balanced salt solution.   Intracameral vigamox 0.1 mL undiluted was injected into the eye and a drop placed onto the ocular surface.  No wound leaks were noted.  The patient was taken to the recovery room in stable condition without complications of anesthesia or surgery  Benay Pillow 03/10/2020, 9:49 AM

## 2020-03-10 NOTE — Anesthesia Postprocedure Evaluation (Signed)
Anesthesia Post Note  Patient: Katelyn Lee  Procedure(s) Performed: CATARACT EXTRACTION PHACO AND INTRAOCULAR LENS PLACEMENT (IOC) RIGHT 11.55  01:10.2 (Right Eye)     Patient location during evaluation: PACU Anesthesia Type: MAC Level of consciousness: awake and alert Pain management: pain level controlled Vital Signs Assessment: post-procedure vital signs reviewed and stable Respiratory status: spontaneous breathing Cardiovascular status: stable Anesthetic complications: no    Jaci Standard, III,  Parrie Rasco D

## 2020-03-10 NOTE — Anesthesia Procedure Notes (Signed)
Procedure Name: MAC Date/Time: 03/10/2020 9:28 AM Performed by: Georga Bora, CRNA Pre-anesthesia Checklist: Patient identified, Emergency Drugs available, Suction available, Patient being monitored and Timeout performed Oxygen Delivery Method: Nasal cannula

## 2020-03-10 NOTE — H&P (Signed)

## 2020-03-10 NOTE — Transfer of Care (Signed)
Immediate Anesthesia Transfer of Care Note  Patient: Katelyn Lee  Procedure(s) Performed: CATARACT EXTRACTION PHACO AND INTRAOCULAR LENS PLACEMENT (IOC) RIGHT 11.55  01:10.2 (Right Eye)  Patient Location: PACU  Anesthesia Type: MAC  Level of Consciousness: awake, alert  and patient cooperative  Airway and Oxygen Therapy: Patient Spontanous Breathing and Patient connected to supplemental oxygen  Post-op Assessment: Post-op Vital signs reviewed, Patient's Cardiovascular Status Stable, Respiratory Function Stable, Patent Airway and No signs of Nausea or vomiting  Post-op Vital Signs: Reviewed and stable  Complications: No apparent anesthesia complications

## 2020-03-10 NOTE — Anesthesia Preprocedure Evaluation (Signed)
Anesthesia Evaluation  Patient identified by MRN, date of birth, ID band Patient awake    Reviewed: Allergy & Precautions, H&P , NPO status , Patient's Chart, lab work & pertinent test results  History of Anesthesia Complications Negative for: history of anesthetic complications  Airway Mallampati: I  TM Distance: >3 FB Neck ROM: full    Dental  (+) Edentulous Upper, Edentulous Lower   Pulmonary shortness of breath,    Pulmonary exam normal breath sounds clear to auscultation       Cardiovascular Normal cardiovascular exam+ dysrhythmias Atrial Fibrillation  Rhythm:regular Rate:Normal  Normal sinus today   Neuro/Psych CVA negative psych ROS   GI/Hepatic Neg liver ROS,   Endo/Other  negative endocrine ROS  Renal/GU negative Renal ROS  negative genitourinary   Musculoskeletal   Abdominal   Peds  Hematology negative hematology ROS (+)   Anesthesia Other Findings   Reproductive/Obstetrics                             Anesthesia Physical Anesthesia Plan  ASA: III  Anesthesia Plan: MAC   Post-op Pain Management:    Induction:   PONV Risk Score and Plan:   Airway Management Planned:   Additional Equipment:   Intra-op Plan:   Post-operative Plan:   Informed Consent: I have reviewed the patients History and Physical, chart, labs and discussed the procedure including the risks, benefits and alternatives for the proposed anesthesia with the patient or authorized representative who has indicated his/her understanding and acceptance.       Plan Discussed with:   Anesthesia Plan Comments:         Anesthesia Quick Evaluation

## 2020-03-11 ENCOUNTER — Encounter: Payer: Self-pay | Admitting: *Deleted

## 2020-03-23 ENCOUNTER — Other Ambulatory Visit: Payer: Self-pay

## 2020-03-23 ENCOUNTER — Emergency Department
Admission: EM | Admit: 2020-03-23 | Discharge: 2020-03-23 | Disposition: A | Discharge status: Home / Self Care | Payer: Medicare Other | Attending: Emergency Medicine | Admitting: Emergency Medicine

## 2020-03-23 ENCOUNTER — Emergency Department: Payer: Medicare Other

## 2020-03-23 DIAGNOSIS — Z7901 Long term (current) use of anticoagulants: Secondary | ICD-10-CM | POA: Insufficient documentation

## 2020-03-23 DIAGNOSIS — E860 Dehydration: Secondary | ICD-10-CM

## 2020-03-23 DIAGNOSIS — Z79899 Other long term (current) drug therapy: Secondary | ICD-10-CM | POA: Insufficient documentation

## 2020-03-23 DIAGNOSIS — N390 Urinary tract infection, site not specified: Secondary | ICD-10-CM

## 2020-03-23 DIAGNOSIS — Z8673 Personal history of transient ischemic attack (TIA), and cerebral infarction without residual deficits: Secondary | ICD-10-CM | POA: Diagnosis not present

## 2020-03-23 DIAGNOSIS — R531 Weakness: Secondary | ICD-10-CM | POA: Diagnosis present

## 2020-03-23 LAB — COMPREHENSIVE METABOLIC PANEL
ALT: 15 U/L (ref 0–44)
AST: 19 U/L (ref 15–41)
Albumin: 3.4 g/dL — ABNORMAL LOW (ref 3.5–5.0)
Alkaline Phosphatase: 50 U/L (ref 38–126)
Anion gap: 9 (ref 5–15)
BUN: 20 mg/dL (ref 8–23)
CO2: 22 mmol/L (ref 22–32)
Calcium: 8.6 mg/dL — ABNORMAL LOW (ref 8.9–10.3)
Chloride: 105 mmol/L (ref 98–111)
Creatinine, Ser: 1.06 mg/dL — ABNORMAL HIGH (ref 0.44–1.00)
GFR calc Af Amer: 57 mL/min — ABNORMAL LOW (ref >60)
GFR calc non Af Amer: 49 mL/min — ABNORMAL LOW (ref >60)
Glucose, Bld: 146 mg/dL — ABNORMAL HIGH (ref 70–99)
Potassium: 3.6 mmol/L (ref 3.5–5.1)
Sodium: 136 mmol/L (ref 135–145)
Total Bilirubin: 0.5 mg/dL (ref 0.3–1.2)
Total Protein: 6.1 g/dL — ABNORMAL LOW (ref 6.5–8.1)

## 2020-03-23 LAB — URINALYSIS, COMPLETE (UACMP) WITH MICROSCOPIC
Bacteria, UA: NONE SEEN
Bilirubin Urine: NEGATIVE
Glucose, UA: NEGATIVE mg/dL
Hgb urine dipstick: NEGATIVE
Ketones, ur: NEGATIVE mg/dL
Nitrite: NEGATIVE
Protein, ur: NEGATIVE mg/dL
Specific Gravity, Urine: 1.025 (ref 1.005–1.030)
pH: 5 (ref 5.0–8.0)

## 2020-03-23 LAB — CBC WITH DIFFERENTIAL/PLATELET
Abs Immature Granulocytes: 0.01 10*3/uL (ref 0.00–0.07)
Basophils Absolute: 0 10*3/uL (ref 0.0–0.1)
Basophils Relative: 1 %
Eosinophils Absolute: 0.1 10*3/uL (ref 0.0–0.5)
Eosinophils Relative: 1 %
HCT: 35.2 % — ABNORMAL LOW (ref 36.0–46.0)
Hemoglobin: 11.4 g/dL — ABNORMAL LOW (ref 12.0–15.0)
Immature Granulocytes: 0 %
Lymphocytes Relative: 30 %
Lymphs Abs: 1.5 10*3/uL (ref 0.7–4.0)
MCH: 29.1 pg (ref 26.0–34.0)
MCHC: 32.4 g/dL (ref 30.0–36.0)
MCV: 89.8 fL (ref 80.0–100.0)
Monocytes Absolute: 0.4 10*3/uL (ref 0.1–1.0)
Monocytes Relative: 7 %
Neutro Abs: 3 10*3/uL (ref 1.7–7.7)
Neutrophils Relative %: 61 %
Platelets: 199 10*3/uL (ref 150–400)
RBC: 3.92 MIL/uL (ref 3.87–5.11)
RDW: 13.4 % (ref 11.5–15.5)
WBC: 5 10*3/uL (ref 4.0–10.5)
nRBC: 0 % (ref 0.0–0.2)

## 2020-03-23 LAB — TROPONIN I (HIGH SENSITIVITY)
Troponin I (High Sensitivity): 4 ng/L (ref <18)
Troponin I (High Sensitivity): 5 ng/L (ref <18)

## 2020-03-23 LAB — LIPASE, BLOOD: Lipase: 98 U/L — ABNORMAL HIGH (ref 11–51)

## 2020-03-23 MED ORDER — CEPHALEXIN 500 MG PO CAPS: 500.0000 mg | ORAL_CAPSULE | Freq: Four times a day (QID) | ORAL | 0 refills | Status: DC

## 2020-03-23 MED ORDER — SODIUM CHLORIDE 0.9 % IV BOLUS
1000.0000 mL | Freq: Once | INTRAVENOUS | Status: AC
  Administered 2020-03-23: 1000 mL via INTRAVENOUS

## 2020-03-23 MED ORDER — CEPHALEXIN 500 MG PO CAPS
500.0000 mg | ORAL_CAPSULE | Freq: Once | ORAL | Status: AC
  Administered 2020-03-23: 500 mg via ORAL
  Filled 2020-03-23: qty 1

## 2020-03-23 NOTE — ED Triage Notes (Signed)
Pt states she was getting up from table and got very weak and felt like she was going to pass out, pt was assisted to bed by family. Pt denies CP, SOB dizziness at this time. Pt c/o weakness at this time. Per EMS, BGL 186, 500 ml nacl given, unable to get orthostatic due to pt dizziness. Pt reports she probably did not eat or drink enough in the last week.  Pt is aox4, nad noted. Color wnl. PERRLA 2 mm bilaterally. Pt able to move all four extremities.

## 2020-03-23 NOTE — ED Notes (Signed)
Pt assisted to toilet and provided mobile phone and assisted calling daughter in law per her request.

## 2020-03-23 NOTE — ED Notes (Signed)
Pt assisted to bathroom, states she feels a lot better and thinks it was because she was dehydrated. Pt states she feels good enough to go home

## 2020-03-23 NOTE — ED Provider Notes (Signed)
[facility] Emergency Department Provider Note  ____________________________________________  Time seen: Approximately 4:13 PM  I have reviewed the triage vital signs and the nursing notes.   HISTORY  Chief Complaint Near Syncope and Weakness    HPI Katelyn Lee is a 84 y.o. female who presents the emergency department via EMS for complaint of generalized weakness.  Patient states that over the past week she has felt increasingly weak.  She states that she has been having to rely on her walker for ambulation more than normal.  Patient denies any specific complaints to include headache, vision changes, neck pain or stiffness, chest pain, shortness of breath, abdominal pain, nausea or vomiting.  No dysuria, polyuria, hematuria.  Patient states that she recently had cataract surgery on her eyes which was uneventful.  This occurred 2 weeks ago.  Patient denies unilateral weakness.  No difficulty formulating thoughts or words.  Patient does have a history of A. fib, dyspnea, CVA.         Past Medical History:  Diagnosis Date  . A-fib ([place])   . Arthritis    legs, lower back  . Dyspnea   . Dysrhythmia    A-FIB  . Goiter   . Insomnia   . Stroke ([place]) 07/29/2017  . Vertigo   . Wears dentures    full upper and lower  . Wears hearing aid in both ears     There are no problems to display for this patient.   Past Surgical History:  Procedure Laterality Date  . CATARACT EXTRACTION W/PHACO Left 01/21/2020   Procedure: CATARACT EXTRACTION PHACO AND INTRAOCULAR LENS PLACEMENT (IOC) LEFT 8.97  00:57.9;  Surgeon: Eulogio Bear, MD;  Location: [facility];  Service: Ophthalmology;  Laterality: Left;  . CATARACT EXTRACTION W/PHACO Right 03/10/2020   Procedure: CATARACT EXTRACTION PHACO AND INTRAOCULAR LENS PLACEMENT (IOC) RIGHT 11.55  01:10.2;  Surgeon: Eulogio Bear, MD;  Location: [facility];  Service: Ophthalmology;  Laterality: Right;  .  CHOLECYSTECTOMY    . COLONOSCOPY    . COLONOSCOPY WITH PROPOFOL N/A 06/28/2017   Procedure: COLONOSCOPY WITH PROPOFOL;  Surgeon: Lollie Sails, MD;  Location: [facility] ENDOSCOPY;  Service: Endoscopy;  Laterality: N/A;  . HERNIA REPAIR    . TONSILLECTOMY      Prior to Admission medications   Medication Sig Start Date End Date Taking? Authorizing Provider  midodrine (PROAMATINE) 2.5 MG tablet Take 2.5 mg by mouth 3 (three) times daily.  09/05/19 09/04/20 Yes [provider]  rivaroxaban (XARELTO) 20 MG TABS tablet Take 20 mg by mouth daily.  05/03/19  Yes [provider]  sertraline (ZOLOFT) 100 MG tablet Take 1.5 tablets by mouth daily. 08/08/19  Yes [provider]  traZODone (DESYREL) 50 MG tablet Take 50 mg by mouth at bedtime.   Yes [provider]  cephALEXin (KEFLEX) 500 MG capsule Take 1 capsule (500 mg total) by mouth 4 (four) times daily. 03/23/20   Kham Zuckerman, Charline Bills, PA-C  apixaban (ELIQUIS) 5 MG TABS tablet Take 5 mg by mouth 2 (two) times daily.  09/29/19  [provider]  gabapentin (NEURONTIN) 300 MG capsule Take 300 mg by mouth at bedtime.  09/29/19  [provider]  ranitidine (ZANTAC) 75 MG tablet Take 1 tablet (75 mg total) by mouth 2 (two) times daily. 05/22/16 09/29/19  Schaevitz, Randall An, MD  zolpidem (AMBIEN) 5 MG tablet Take 5 mg by mouth at bedtime as needed for sleep.  09/29/19  [provider]    Allergies Coumadin [warfarin sodium]  Family History  Problem Relation Age of Onset  . Depression Mother   . Heart disease Father     Social History Social History   Tobacco Use  . Smoking status: Never Smoker  . Smokeless tobacco: Never Used  Substance Use Topics  . Alcohol use: No  . Drug use: No     Review of Systems  Constitutional: No fever/chills.  Positive for generalized weakness. Eyes: No visual changes. No discharge ENT: No upper respiratory complaints. Cardiovascular: no chest  pain. Respiratory: no cough. No SOB. Gastrointestinal: No abdominal pain.  No nausea, no vomiting.  No diarrhea.  No constipation. Genitourinary: Negative for dysuria. No hematuria Musculoskeletal: Negative for musculoskeletal pain. Skin: Negative for rash, abrasions, lacerations, ecchymosis. Neurological: Negative for headaches, focal weakness or numbness. 10-point ROS otherwise negative.  ____________________________________________   PHYSICAL EXAM:  VITAL SIGNS: ED Triage Vitals  Enc Vitals Group     BP      Pulse      Resp      Temp      Temp src      SpO2      Weight      Height      Head Circumference      Peak Flow      Pain Score      Pain Loc      Pain Edu?      Excl. in [place]?      Constitutional: Alert and oriented. Well appearing and in no acute distress. Eyes: Conjunctivae are normal. PERRL. EOMI. Head: Atraumatic. ENT:      Ears:       Nose: No congestion/rhinnorhea.      Mouth/Throat: Mucous membranes are moist.  Oropharynx is nonerythematous and nonedematous.  Uvula is midline. Neck: No stridor.  Neck is supple full range of motion Hematological/Lymphatic/Immunilogical: No cervical lymphadenopathy. Cardiovascular: Normal rate, regular rhythm. Normal S1 and S2.  Good peripheral circulation. Respiratory: Normal respiratory effort without tachypnea or retractions. Lungs CTAB. Good air entry to the bases with no decreased or absent breath sounds. Gastrointestinal: Bowel sounds 4 quadrants. Soft and nontender to palpation. No guarding or rigidity. No palpable masses. No distention. No CVA tenderness. Musculoskeletal: Full range of motion to all extremities. No gross deformities appreciated. Neurologic:  Normal speech and language. No gross focal neurologic deficits are appreciated.  Cranial nerves II through XII grossly intact.  Negative pronator drift.  Equal grip strength upper extremities. Skin:  Skin is warm, dry and intact. No rash noted. Psychiatric:  Mood and affect are normal. Speech and behavior are normal. Patient exhibits appropriate insight and judgement.   ____________________________________________   LABS (all labs ordered are listed, but only abnormal results are displayed)  Labs Reviewed  COMPREHENSIVE METABOLIC PANEL - Abnormal; Notable for the following components:      Result Value   Glucose, Bld 146 (*)    Creatinine, Ser 1.06 (*)    Calcium 8.6 (*)    Total Protein 6.1 (*)    Albumin 3.4 (*)    GFR calc non Af Amer 49 (*)    GFR calc Af Amer 57 (*)    All other components within normal limits  LIPASE, BLOOD - Abnormal; Notable for the following components:   Lipase 98 (*)    All other components within normal limits  CBC WITH DIFFERENTIAL/PLATELET - Abnormal; Notable for the following components:   Hemoglobin 11.4 (*)  HCT 35.2 (*)    All other components within normal limits  URINALYSIS, COMPLETE (UACMP) WITH MICROSCOPIC - Abnormal; Notable for the following components:   Color, Urine YELLOW (*)    APPearance HAZY (*)    Leukocytes,Ua MODERATE (*)    All other components within normal limits  URINE CULTURE  TROPONIN I (HIGH SENSITIVITY)  TROPONIN I (HIGH SENSITIVITY)   ____________________________________________  EKG  ED ECG REPORT I, Charline Bills Kenzie Flakes,  personally viewed and interpreted this ECG.   Date: 03/23/2020  EKG Time: 1610 hrs.  Rate: 71 bpm  Rhythm: unchanged from previous tracings, normal sinus rhythm  Axis: Normal axis  Intervals:none  ST&T Change: Slight depression noted 1 beat on V5, V6, however no reciprocal changes.  Remaining QRS complexes with no depression  ____________________________________________  RADIOLOGY I personally viewed and evaluated these images as part of my medical decision making, as well as reviewing the written report by the radiologist.  DG Chest 1 View  Result Date: 03/23/2020 CLINICAL DATA:  Weakness and lightheadedness. EXAM: CHEST  1 VIEW  COMPARISON:  May 05, 2009 FINDINGS: Cardiomediastinal silhouette is normal. Mediastinal contours appear intact. Calcific atherosclerotic disease and tortuosity of the aorta. There is no evidence of focal airspace consolidation, pleural effusion or pneumothorax. Mild coarsening of the interstitial markings. Biapical subpleural thickening. Osseous structures are without acute abnormality. Soft tissues are grossly normal. IMPRESSION: 1. No evidence of acute cardiopulmonary disease. 2. Calcific atherosclerotic disease and tortuosity of the aorta. Electronically Signed   By: Fidela Salisbury M.D.   On: 03/23/2020 16:41    ____________________________________________    PROCEDURES  Procedure(s) performed:    Procedures    Medications  cephALEXin (KEFLEX) capsule 500 mg (has no administration in time range)  sodium chloride 0.9 % bolus 1,000 mL (0 mLs Intravenous Stopped 03/23/20 1749)     ____________________________________________   INITIAL IMPRESSION / ASSESSMENT AND PLAN / ED COURSE  Pertinent labs & imaging results that were available during my care of the patient were reviewed by me and considered in my medical decision making (see chart for details).  Review of the [place] CSRS was performed in accordance of the [place] prior to dispensing any controlled drugs.           Patient's diagnosis is consistent with UTI, dehydration.  Patient presented to the emergency department with increased weakness progressively over the past week.  Patient states that he began feeling slightly weak, then had decreased intake of food and liquids as she was "feeling poorly."  Patient states that today she began feeling very weak.  She presents the emergency department for evaluation.  No fever, URI symptoms, chest pain, abdominal pain.  On work-up, patient with reassuring results.  Patient did have some leukocytes in her urine.  At this time I will treat for urinary tract infection, however also send urine  for culture.  If culture does not grow, primary care can stop antibiotics.  Patient states that she feels much better after receiving IV fluids here in the emergency department.  Initially patient states that she was too weak to stand and walk, patient was able to ambulate to the restroom at this time.  Again I will start the patient on antibiotics, encourage good eating and drinking at home.  If patient develops any other symptoms she may return to the emergency department or follow-up primary care..  Patient is given ED precautions to return to the ED for any worsening or new symptoms.  ____________________________________________  FINAL CLINICAL IMPRESSION(S) / ED DIAGNOSES  Final diagnoses:  Lower urinary tract infectious disease  Mild dehydration      NEW MEDICATIONS STARTED DURING THIS VISIT:  ED Discharge Orders         Ordered    cephALEXin (KEFLEX) 500 MG capsule  4 times daily     03/23/20 1823              This chart was dictated using voice recognition software/Dragon. Despite best efforts to proofread, errors can occur which can change the meaning. Any change was purely unintentional.    Darletta Moll, PA-C 03/23/20 1823    Nance Pear, MD 03/23/20 [phone]

## 2020-03-26 LAB — URINE CULTURE: Culture: 10000 — AB

## 2020-03-27 NOTE — Progress Notes (Signed)
ED Antimicrobial Stewardship Positive Culture Follow Up   Katelyn Lee is an 84 y.o. female who presented to [facility] on 03/23/2020 with a chief complaint of  Chief Complaint  Patient presents with  . Near Syncope  . Weakness    Recent Results (from the past 720 hour(s))  SARS CORONAVIRUS 2 (TAT 6-24 HRS) Nasopharyngeal Nasopharyngeal Swab     Status: None   Collection Time: 03/06/20 11:16 AM   Specimen: Nasopharyngeal Swab  Result Value Ref Range Status   SARS Coronavirus 2 NEGATIVE NEGATIVE Final    Comment: (NOTE) SARS-CoV-2 target nucleic acids are NOT DETECTED. The SARS-CoV-2 RNA is generally detectable in upper and lower respiratory specimens during the acute phase of infection. Negative results do not preclude SARS-CoV-2 infection, do not rule out co-infections with other pathogens, and should not be used as the sole basis for treatment or other patient management decisions. Negative results must be combined with clinical observations, patient history, and epidemiological information. The expected result is Negative. Fact Sheet for Patients: SugarRoll.be Fact Sheet for Healthcare Providers: https://www.woods-mathews.com/ This test is not yet approved or cleared by the [place] FDA and  has been authorized for detection and/or diagnosis of SARS-CoV-2 by FDA under an Emergency Use Authorization (EUA). This EUA will remain  in effect (meaning this test can be used) for the duration of the COVID-19 declaration under Section 56 4(b)(1) of the Act, 21 U.S.C. section 360bbb-3(b)(1), unless the authorization is terminated or revoked sooner. Performed at [facility], [place] [address], [place], [place] [address]   Urine culture     Status: Abnormal   Collection Time: 03/23/20  4:17 PM   Specimen: Urine, Random  Result Value Ref Range Status   Specimen Description   Final    URINE, RANDOM Performed at [facility]  [address] [place], [place] [address]    Special Requests   Final    NONE Performed at [facility], [place], [place] [address]    Culture 10,000 COLONIES/mL ENTEROCOCCUS FAECALIS (A)  Final   Report Status 03/26/2020 FINAL  Final   Organism ID, Bacteria ENTEROCOCCUS FAECALIS (A)  Final      Susceptibility   Enterococcus faecalis - MIC*    AMPICILLIN <=2 SENSITIVE Sensitive     NITROFURANTOIN <=16 SENSITIVE Sensitive     VANCOMYCIN 1 SENSITIVE Sensitive     * 10,000 COLONIES/mL ENTEROCOCCUS FAECALIS    [x]  Treated with cephalexin, organism resistant to prescribed antimicrobial []  Patient discharged originally without antimicrobial agent and treatment is now indicated  New antibiotic prescription: amoxicillin 500mg  po twice daily for 7 days  ED Provider: Emilee Hero, MD  Notes: I have communicated all of the above information to [name] who agrees to the treatment plan. The prescription was called in to [facility] in [place], [place] [phone])   Dallie Piles 03/27/2020, 2:54 PM Clinical Pharmacist Monday - Friday phone -  [phone] Saturday - Sunday phone - [phone]

## 2020-09-30 ENCOUNTER — Other Ambulatory Visit: Payer: Self-pay | Admitting: Orthopedic Surgery

## 2020-09-30 DIAGNOSIS — M1711 Unilateral primary osteoarthritis, right knee: Secondary | ICD-10-CM

## 2020-09-30 DIAGNOSIS — M2391 Unspecified internal derangement of right knee: Secondary | ICD-10-CM

## 2020-10-15 ENCOUNTER — Ambulatory Visit: Admission: RE | Admit: 2020-10-15 | Payer: Medicare Other | Source: Ambulatory Visit

## 2020-11-24 ENCOUNTER — Encounter: Payer: Self-pay | Admitting: Emergency Medicine

## 2020-11-24 ENCOUNTER — Other Ambulatory Visit: Payer: Self-pay

## 2020-11-24 ENCOUNTER — Ambulatory Visit (INDEPENDENT_AMBULATORY_CARE_PROVIDER_SITE_OTHER): Payer: Medicare Other

## 2020-11-24 ENCOUNTER — Ambulatory Visit
Admission: EM | Admit: 2020-11-24 | Discharge: 2020-11-24 | Disposition: A | Discharge status: Home / Self Care | Payer: Medicare Other | Attending: Physician Assistant | Admitting: Physician Assistant

## 2020-11-24 DIAGNOSIS — W19XXXA Unspecified fall, initial encounter: Secondary | ICD-10-CM

## 2020-11-24 DIAGNOSIS — M25552 Pain in left hip: Secondary | ICD-10-CM | POA: Diagnosis not present

## 2020-11-24 DIAGNOSIS — M545 Low back pain, unspecified: Secondary | ICD-10-CM | POA: Diagnosis not present

## 2020-11-24 DIAGNOSIS — G8929 Other chronic pain: Secondary | ICD-10-CM | POA: Diagnosis not present

## 2020-11-24 MED ORDER — TRAMADOL HCL 50 MG PO TABS: 50.0000 mg | ORAL_TABLET | Freq: Three times a day (TID) | ORAL | 0 refills | Status: DC | PRN

## 2020-11-24 NOTE — ED Provider Notes (Signed)
[facility]    CSN: [id number] Arrival date & time: 11/24/20  1124      History   Chief Complaint Chief Complaint  Patient presents with  . Hip Pain    left    HPI Katelyn Lee is a 84 y.o. female states that she lost her balance and excellently fell 1 week ago.  She states that she has had lateral hip pain and increased pain in the lower back since.   She says the pain is about 8-9 out of 10.  Patient does already have chronic back pain and right knee pain and states she follows up with [facility] clinic for those issues.  She has been taking Tylenol for pain without much relief.  Patient states that her pain keeps her up at night.  She denies any numbness or tingling.  She states that her left hip feels weak and she is already had right knee weakness from "really bad" arthritis.  Patient does have A. fib and takes Xarelto.  She denies any bruising in the area injured in the fall.  She does not have any other complaints or concerns today.  HPI  Past Medical History:  Diagnosis Date  . A-fib ([place])   . Arthritis    legs, lower back  . Dyspnea   . Dysrhythmia    A-FIB  . Goiter   . Insomnia   . Stroke ([place]) 07/29/2017  . Vertigo   . Wears dentures    full upper and lower  . Wears hearing aid in both ears     There are no problems to display for this patient.   Past Surgical History:  Procedure Laterality Date  . CATARACT EXTRACTION W/PHACO Left 01/21/2020   Procedure: CATARACT EXTRACTION PHACO AND INTRAOCULAR LENS PLACEMENT (IOC) LEFT 8.97  00:57.9;  Surgeon: Eulogio Bear, MD;  Location: [facility];  Service: Ophthalmology;  Laterality: Left;  . CATARACT EXTRACTION W/PHACO Right 03/10/2020   Procedure: CATARACT EXTRACTION PHACO AND INTRAOCULAR LENS PLACEMENT (IOC) RIGHT 11.55  01:10.2;  Surgeon: Eulogio Bear, MD;  Location: [facility];  Service: Ophthalmology;  Laterality: Right;  . CHOLECYSTECTOMY    . COLONOSCOPY    . COLONOSCOPY  WITH PROPOFOL N/A 06/28/2017   Procedure: COLONOSCOPY WITH PROPOFOL;  Surgeon: Lollie Sails, MD;  Location: [facility] ENDOSCOPY;  Service: Endoscopy;  Laterality: N/A;  . HERNIA REPAIR    . TONSILLECTOMY      OB History   No obstetric history on file.      Home Medications    Prior to Admission medications   Medication Sig Start Date End Date Taking? Authorizing Provider  cephALEXin (KEFLEX) 500 MG capsule Take 1 capsule (500 mg total) by mouth 4 (four) times daily. 03/23/20   Cuthriell, Charline Bills, PA-C  rivaroxaban (XARELTO) 20 MG TABS tablet Take 20 mg by mouth daily.  05/03/19   [provider]  sertraline (ZOLOFT) 100 MG tablet Take 1.5 tablets by mouth daily. 08/08/19   [provider]  traMADol (ULTRAM) 50 MG tablet Take 1 tablet (50 mg total) by mouth every 8 (eight) hours as needed for up to 5 days. 11/24/20 11/29/20  Danton Clap, PA-C  traZODone (DESYREL) 50 MG tablet Take 50 mg by mouth at bedtime.    [provider]  apixaban (ELIQUIS) 5 MG TABS tablet Take 5 mg by mouth 2 (two) times daily.  09/29/19  [provider]  gabapentin (NEURONTIN) 300 MG capsule Take 300  mg by mouth at bedtime.  09/29/19  [provider]  ranitidine (ZANTAC) 75 MG tablet Take 1 tablet (75 mg total) by mouth 2 (two) times daily. 05/22/16 09/29/19  Schaevitz, Randall An, MD  zolpidem (AMBIEN) 5 MG tablet Take 5 mg by mouth at bedtime as needed for sleep.  09/29/19  [provider]    Family History Family History  Problem Relation Age of Onset  . Depression Mother   . Heart disease Father     Social History Social History   Tobacco Use  . Smoking status: Never Smoker  . Smokeless tobacco: Never Used  Vaping Use  . Vaping Use: Never used  Substance Use Topics  . Alcohol use: No  . Drug use: No     Allergies   Coumadin [warfarin sodium]   Review of Systems Review of Systems  Musculoskeletal: Positive for arthralgias (left  hip), back pain and gait problem. Negative for joint swelling.  Skin: Negative for color change, rash and wound.  Neurological: Positive for weakness (left hip, right knee). Negative for numbness.  Hematological: Bruises/bleeds easily.     Physical Exam Triage Vital Signs ED Triage Vitals  Enc Vitals Group     BP 11/24/20 1230 104/62     Pulse Rate 11/24/20 1230 71     Resp 11/24/20 1230 18     Temp 11/24/20 1230 98.1 F (36.7 C)     Temp Source 11/24/20 1230 Oral     SpO2 11/24/20 1230 99 %     Weight 11/24/20 1229 184 lb 1.4 oz (83.5 kg)     Height 11/24/20 1229 5\' 8"  (1.727 m)     Head Circumference --      Peak Flow --      Pain Score 11/24/20 1228 10     Pain Loc --      Pain Edu? --      Excl. in [place]? --    No data found.  Updated Vital Signs BP 104/62 (BP Location: Right Arm)   Pulse 71   Temp 98.1 F (36.7 C) (Oral)   Resp 18   Ht 5\' 8"  (1.727 m)   Wt 184 lb 1.4 oz (83.5 kg)   SpO2 99%   BMI 27.99 kg/m       Physical Exam Vitals and nursing note reviewed.  Constitutional:      General: She is not in acute distress.    Appearance: Normal appearance. She is not ill-appearing or toxic-appearing.  HENT:     Head: Normocephalic and atraumatic.  Eyes:     General: No scleral icterus.       Right eye: No discharge.        Left eye: No discharge.     Conjunctiva/sclera: Conjunctivae normal.  Cardiovascular:     Rate and Rhythm: Normal rate and regular rhythm.     Heart sounds: Normal heart sounds.  Pulmonary:     Effort: Pulmonary effort is normal. No respiratory distress.     Breath sounds: Normal breath sounds.  Musculoskeletal:     Cervical back: Neck supple.     Lumbar back: Tenderness (left lumbar paravertebral muscles) present. No bony tenderness. Negative right straight leg raise test and negative left straight leg raise test.     Left hip: Tenderness (greater trocanter and lateral hip diffusely) present. Decreased range of motion (painful  internal rotation). Normal strength.  Skin:    General: Skin is dry.     Findings: No bruising.  Neurological:     General: No focal deficit present.     Mental Status: She is alert. Mental status is at baseline.     Motor: No weakness.     Gait: Gait abnormal (walks with cane).  Psychiatric:        Mood and Affect: Mood normal.        Behavior: Behavior normal.        Thought Content: Thought content normal.      UC Treatments / Results  Labs (all labs ordered are listed, but only abnormal results are displayed) Labs Reviewed - No data to display  EKG   Radiology DG Hip Unilat With Pelvis 2-3 Views Left  Result Date: 11/24/2020 CLINICAL DATA:  Left hip pain after fall last week. EXAM: DG HIP (WITH OR WITHOUT PELVIS) 2-3V LEFT COMPARISON:  None. FINDINGS: There is no evidence of hip fracture or dislocation. Mild osteophyte formation is seen involving the left hip. IMPRESSION: Mild osteoarthritis of the left hip.  No acute abnormality is noted. Electronically Signed   By: Marijo Conception M.D.   On: 11/24/2020 13:15    Procedures Procedures (including critical care time)  Medications Ordered in UC Medications - No data to display  Initial Impression / Assessment and Plan / UC Course  I have reviewed the triage vital signs and the nursing notes.  Pertinent labs & imaging results that were available during my care of the patient were reviewed by me and considered in my medical decision making (see chart for details).   84 year old female presenting for left-sided hip pain following a fall 1 week ago.  X-ray of left hip obtained which is negative for any fractures.  Discussed results with patient.  Since patient says that Tylenol has not helped her pain and she is unable to get a sleep at night, I did offer Ultram but advised her to be very careful with this medication since it can make her a little drowsy.  She has a follow-up with [facility] in 2 days.  Advised her to keep  the appointment.  Advised her to try to ice the hip and stay off of it until she can see her provider [facility].  Advised her to follow-up with our department as needed.   Final Clinical Impressions(s) / UC Diagnoses   Final diagnoses:  Fall  Left hip pain  Chronic low back pain without sciatica, unspecified back pain laterality     Discharge Instructions     The hip x-ray is negative for any fractures today.  Keep your appointment with [facility] for in 2 days.  Your hip pain could possibly be related to your chronic back pain.  Ice the area that hurts.  Consider heat if ice does not seem to help.  You can take Tylenol or the Ultram as prescribed for pain.  Follow-up with our department as needed.   ED Prescriptions    Medication Sig Dispense Auth. Provider   traMADol (ULTRAM) 50 MG tablet Take 1 tablet (50 mg total) by mouth every 8 (eight) hours as needed for up to 5 days. 9 tablet Danton Clap, PA-C     I have reviewed the PDMP during this encounter.   Danton Clap, PA-C 11/24/20 1344

## 2020-11-24 NOTE — ED Triage Notes (Signed)
Patient states she fell one week ago and is c/o left hip pain. She states she is having difficulty walking.

## 2020-11-24 NOTE — Discharge Instructions (Signed)
The hip x-ray is negative for any fractures today.  Keep your appointment with [facility] for in 2 days.  Your hip pain could possibly be related to your chronic back pain.  Ice the area that hurts.  Consider heat if ice does not seem to help.  You can take Tylenol or the Ultram as prescribed for pain.  Follow-up with our department as needed.

## 2020-11-26 ENCOUNTER — Other Ambulatory Visit: Payer: Self-pay | Admitting: Orthopedic Surgery

## 2020-11-26 DIAGNOSIS — M25552 Pain in left hip: Secondary | ICD-10-CM

## 2020-11-27 ENCOUNTER — Emergency Department: Payer: Medicare Other

## 2020-11-27 ENCOUNTER — Other Ambulatory Visit: Payer: Self-pay

## 2020-11-27 ENCOUNTER — Inpatient Hospital Stay
Admission: EM | Admit: 2020-11-27 | Discharge: 2020-12-03 | DRG: 481 | Disposition: A | Discharge status: Skilled Nursing Facility | Payer: Medicare Other | Attending: Internal Medicine | Admitting: Internal Medicine

## 2020-11-27 ENCOUNTER — Ambulatory Visit
Admission: RE | Admit: 2020-11-27 | Discharge: 2020-11-27 | Disposition: A | Discharge status: Home / Self Care | Payer: Medicare Other | Source: Ambulatory Visit | Attending: Orthopedic Surgery | Admitting: Orthopedic Surgery

## 2020-11-27 DIAGNOSIS — I951 Orthostatic hypotension: Secondary | ICD-10-CM | POA: Diagnosis not present

## 2020-11-27 DIAGNOSIS — Z79899 Other long term (current) drug therapy: Secondary | ICD-10-CM

## 2020-11-27 DIAGNOSIS — Z20822 Contact with and (suspected) exposure to covid-19: Secondary | ICD-10-CM | POA: Diagnosis present

## 2020-11-27 DIAGNOSIS — F411 Generalized anxiety disorder: Secondary | ICD-10-CM | POA: Diagnosis present

## 2020-11-27 DIAGNOSIS — Z8673 Personal history of transient ischemic attack (TIA), and cerebral infarction without residual deficits: Secondary | ICD-10-CM

## 2020-11-27 DIAGNOSIS — F419 Anxiety disorder, unspecified: Secondary | ICD-10-CM | POA: Diagnosis present

## 2020-11-27 DIAGNOSIS — D62 Acute posthemorrhagic anemia: Secondary | ICD-10-CM | POA: Diagnosis not present

## 2020-11-27 DIAGNOSIS — I480 Paroxysmal atrial fibrillation: Secondary | ICD-10-CM | POA: Diagnosis present

## 2020-11-27 DIAGNOSIS — Z66 Do not resuscitate: Secondary | ICD-10-CM | POA: Diagnosis present

## 2020-11-27 DIAGNOSIS — Y92008 Other place in unspecified non-institutional (private) residence as the place of occurrence of the external cause: Secondary | ICD-10-CM

## 2020-11-27 DIAGNOSIS — M25552 Pain in left hip: Secondary | ICD-10-CM

## 2020-11-27 DIAGNOSIS — E559 Vitamin D deficiency, unspecified: Secondary | ICD-10-CM | POA: Diagnosis present

## 2020-11-27 DIAGNOSIS — Z7901 Long term (current) use of anticoagulants: Secondary | ICD-10-CM

## 2020-11-27 DIAGNOSIS — S72145A Nondisplaced intertrochanteric fracture of left femur, initial encounter for closed fracture: Principal | ICD-10-CM | POA: Diagnosis present

## 2020-11-27 DIAGNOSIS — S76312A Strain of muscle, fascia and tendon of the posterior muscle group at thigh level, left thigh, initial encounter: Secondary | ICD-10-CM | POA: Diagnosis present

## 2020-11-27 DIAGNOSIS — K219 Gastro-esophageal reflux disease without esophagitis: Secondary | ICD-10-CM | POA: Diagnosis present

## 2020-11-27 DIAGNOSIS — W109XXA Fall (on) (from) unspecified stairs and steps, initial encounter: Secondary | ICD-10-CM | POA: Diagnosis present

## 2020-11-27 DIAGNOSIS — Z23 Encounter for immunization: Secondary | ICD-10-CM

## 2020-11-27 DIAGNOSIS — Z419 Encounter for procedure for purposes other than remedying health state, unspecified: Secondary | ICD-10-CM

## 2020-11-27 DIAGNOSIS — F0390 Unspecified dementia without behavioral disturbance: Secondary | ICD-10-CM | POA: Diagnosis present

## 2020-11-27 DIAGNOSIS — E782 Mixed hyperlipidemia: Secondary | ICD-10-CM | POA: Diagnosis present

## 2020-11-27 LAB — CBC WITH DIFFERENTIAL/PLATELET
Abs Immature Granulocytes: 0.02 10*3/uL (ref 0.00–0.07)
Basophils Absolute: 0.1 10*3/uL (ref 0.0–0.1)
Basophils Relative: 1 %
Eosinophils Absolute: 0.2 10*3/uL (ref 0.0–0.5)
Eosinophils Relative: 2 %
HCT: 36.6 % (ref 36.0–46.0)
Hemoglobin: 11.6 g/dL — ABNORMAL LOW (ref 12.0–15.0)
Immature Granulocytes: 0 %
Lymphocytes Relative: 20 %
Lymphs Abs: 1.4 10*3/uL (ref 0.7–4.0)
MCH: 29.5 pg (ref 26.0–34.0)
MCHC: 31.7 g/dL (ref 30.0–36.0)
MCV: 93.1 fL (ref 80.0–100.0)
Monocytes Absolute: 0.7 10*3/uL (ref 0.1–1.0)
Monocytes Relative: 10 %
Neutro Abs: 4.8 10*3/uL (ref 1.7–7.7)
Neutrophils Relative %: 67 %
Platelets: 222 10*3/uL (ref 150–400)
RBC: 3.93 MIL/uL (ref 3.87–5.11)
RDW: 13.7 % (ref 11.5–15.5)
WBC: 7.2 10*3/uL (ref 4.0–10.5)
nRBC: 0.6 % — ABNORMAL HIGH (ref 0.0–0.2)

## 2020-11-27 LAB — RESP PANEL BY RT-PCR (FLU A&B, COVID) ARPGX2
Influenza A by PCR: NEGATIVE
Influenza B by PCR: NEGATIVE
SARS Coronavirus 2 by RT PCR: NEGATIVE

## 2020-11-27 LAB — COMPREHENSIVE METABOLIC PANEL
ALT: 19 U/L (ref 0–44)
AST: 28 U/L (ref 15–41)
Albumin: 3.6 g/dL (ref 3.5–5.0)
Alkaline Phosphatase: 75 U/L (ref 38–126)
Anion gap: 8 (ref 5–15)
BUN: 21 mg/dL (ref 8–23)
CO2: 24 mmol/L (ref 22–32)
Calcium: 8.8 mg/dL — ABNORMAL LOW (ref 8.9–10.3)
Chloride: 104 mmol/L (ref 98–111)
Creatinine, Ser: 1 mg/dL (ref 0.44–1.00)
GFR, Estimated: 56 mL/min — ABNORMAL LOW (ref >60)
Glucose, Bld: 98 mg/dL (ref 70–99)
Potassium: 4.8 mmol/L (ref 3.5–5.1)
Sodium: 136 mmol/L (ref 135–145)
Total Bilirubin: 0.7 mg/dL (ref 0.3–1.2)
Total Protein: 6.8 g/dL (ref 6.5–8.1)

## 2020-11-27 LAB — PROTIME-INR
INR: 2.7 — ABNORMAL HIGH (ref 0.8–1.2)
Prothrombin Time: 27.8 seconds — ABNORMAL HIGH (ref 11.4–15.2)

## 2020-11-27 LAB — TYPE AND SCREEN
ABO/RH(D): A POS
Antibody Screen: NEGATIVE

## 2020-11-27 MED ORDER — MORPHINE SULFATE (PF) 2 MG/ML IV SOLN
0.5000 mg | INTRAVENOUS | Status: DC | PRN
Start: 2020-11-27 — End: 2020-11-27

## 2020-11-27 MED ORDER — SERTRALINE HCL 50 MG PO TABS
150.0000 mg | ORAL_TABLET | Freq: Every day | ORAL | Status: DC
  Administered 2020-11-29 – 2020-12-03 (×5): 150 mg via ORAL
  Filled 2020-11-27 (×5): qty 3

## 2020-11-27 MED ORDER — MORPHINE SULFATE (PF) 2 MG/ML IV SOLN: 2.0000 mg | INTRAVENOUS | Status: DC | PRN

## 2020-11-27 MED ORDER — ACETAMINOPHEN 325 MG PO TABS: 650.0000 mg | ORAL_TABLET | Freq: Four times a day (QID) | ORAL | Status: DC | PRN

## 2020-11-27 MED ORDER — HYDROCODONE-ACETAMINOPHEN 5-325 MG PO TABS
1.0000 | ORAL_TABLET | Freq: Four times a day (QID) | ORAL | Status: DC | PRN
  Administered 2020-11-27: 1 via ORAL
  Filled 2020-11-27: qty 1

## 2020-11-27 MED ORDER — CEFAZOLIN SODIUM-DEXTROSE 2-4 GM/100ML-% IV SOLN
2.0000 g | INTRAVENOUS | Status: AC
  Administered 2020-11-28: 2 g via INTRAVENOUS
  Filled 2020-11-27: qty 100

## 2020-11-27 MED ORDER — MORPHINE SULFATE (PF) 2 MG/ML IV SOLN
2.0000 mg | Freq: Once | INTRAVENOUS | Status: AC
  Administered 2020-11-27: 2 mg via INTRAVENOUS
  Filled 2020-11-27: qty 1

## 2020-11-27 MED ORDER — ROSUVASTATIN CALCIUM 5 MG PO TABS
5.0000 mg | ORAL_TABLET | Freq: Every day | ORAL | Status: DC
  Administered 2020-11-29 – 2020-12-03 (×5): 5 mg via ORAL
  Filled 2020-11-27 (×6): qty 1

## 2020-11-27 MED ORDER — SENNOSIDES-DOCUSATE SODIUM 8.6-50 MG PO TABS
1.0000 | ORAL_TABLET | Freq: Every evening | ORAL | Status: DC | PRN
Start: 2020-11-27 — End: 2020-11-28

## 2020-11-27 NOTE — ED Notes (Signed)
Spoke with Stark Klein, NP who gave verbal permission to give PO pain meds.

## 2020-11-27 NOTE — ED Notes (Signed)
Contacted floor RN via secure chat. Awaiting reply. Pt in  NAD at this time. VSS.

## 2020-11-27 NOTE — ED Provider Notes (Signed)
[facility] Emergency Department Provider Note ____________________________________________   Event Date/Time   First MD Initiated Contact with Patient 11/27/20 1713     (approximate)  I have reviewed the triage vital signs and the nursing notes.  HISTORY  Chief Complaint Hip Pain   HPI Katelyn Lee is a 84 y.o. femalewho presents to the ED for evaluation of hip pain with known fracture.  Chart review indicates A. fib on Xarelto, HLD and previous stroke. Patient seen at urgent care 3 days ago due to left hip pain that had been present for 1 week.  X-ray at that time negative for fracture.  Followed up with orthopedics as an outpatient yesterday and saw one of their PAs, MRI was ordered due to inability to bear weight.  MRI obtained and read today indicates an acute nondisplaced intertrochanteric fracture of the left proximal femur.  Patient confirms mechanical fall that occurred about 10 days ago, falling on her left side.  She reports associated pain to her left hip that is sharp and stabbing in nature, nonradiating.  She reports the pain is worst when she is trying to ambulate, and she has been reliant upon her walker for the past week and reports inability to ambulate effectively even with this.  Currently reporting 10/10 intensity pain.  Denies recent illnesses, subsequent syncopal episodes or strokelike symptoms.  Past Medical History:  Diagnosis Date  . A-fib ([place])   . Arthritis    legs, lower back  . Dyspnea   . Dysrhythmia    A-FIB  . Goiter   . Insomnia   . Stroke ([place]) 07/29/2017  . Vertigo   . Wears dentures    full upper and lower  . Wears hearing aid in both ears     There are no problems to display for this patient.   Past Surgical History:  Procedure Laterality Date  . CATARACT EXTRACTION W/PHACO Left 01/21/2020   Procedure: CATARACT EXTRACTION PHACO AND INTRAOCULAR LENS PLACEMENT (IOC) LEFT 8.97  00:57.9;  Surgeon: Eulogio Bear, MD;  Location: [place];  Service: Ophthalmology;  Laterality: Left;  . CATARACT EXTRACTION W/PHACO Right 03/10/2020   Procedure: CATARACT EXTRACTION PHACO AND INTRAOCULAR LENS PLACEMENT (IOC) RIGHT 11.55  01:10.2;  Surgeon: Eulogio Bear, MD;  Location: [place];  Service: Ophthalmology;  Laterality: Right;  . CHOLECYSTECTOMY    . COLONOSCOPY    . COLONOSCOPY WITH PROPOFOL N/A 06/28/2017   Procedure: COLONOSCOPY WITH PROPOFOL;  Surgeon: Lollie Sails, MD;  Location: [facility] ENDOSCOPY;  Service: Endoscopy;  Laterality: N/A;  . HERNIA REPAIR    . TONSILLECTOMY      Prior to Admission medications   Medication Sig Start Date End Date Taking? Authorizing Provider  celecoxib (CELEBREX) 100 MG capsule Take 100 mg by mouth daily as needed (pain.). 11/17/20   [provider]  rivaroxaban (XARELTO) 20 MG TABS tablet Take 20 mg by mouth daily after breakfast. 05/03/19   [provider]  rosuvastatin (CRESTOR) 5 MG tablet Take 5 mg by mouth daily after breakfast. 10/28/20   [provider]  sertraline (ZOLOFT) 100 MG tablet Take 150 mg by mouth daily after breakfast. 08/08/19   [provider]  traMADol (ULTRAM) 50 MG tablet Take 1 tablet (50 mg total) by mouth every 8 (eight) hours as needed for up to 5 days. 11/24/20 11/29/20  Danton Clap, PA-C  apixaban (ELIQUIS) 5 MG TABS tablet Take 5 mg by mouth 2 (two)  times daily.  09/29/19  [provider]  gabapentin (NEURONTIN) 300 MG capsule Take 300 mg by mouth at bedtime.  09/29/19  [provider]  ranitidine (ZANTAC) 75 MG tablet Take 1 tablet (75 mg total) by mouth 2 (two) times daily. 05/22/16 09/29/19  Schaevitz, Randall An, MD  zolpidem (AMBIEN) 5 MG tablet Take 5 mg by mouth at bedtime as needed for sleep.  09/29/19  [provider]    Allergies Fludrocortisone and Coumadin [warfarin sodium]  Family History  Problem Relation Age of Onset  .  Depression Mother   . Heart disease Father     Social History Social History   Tobacco Use  . Smoking status: Never Smoker  . Smokeless tobacco: Never Used  Vaping Use  . Vaping Use: Never used  Substance Use Topics  . Alcohol use: No  . Drug use: No    Review of Systems  Constitutional: No fever/chills Eyes: No visual changes. ENT: No sore throat. Cardiovascular: Denies chest pain. Respiratory: Denies shortness of breath. Gastrointestinal: No abdominal pain.  No nausea, no vomiting.  No diarrhea.  No constipation. Genitourinary: Negative for dysuria. Musculoskeletal: Negative for back pain.  Positive for left hip pain. Skin: Negative for rash. Neurological: Negative for headaches, focal weakness or numbness.  ____________________________________________   PHYSICAL EXAM:  VITAL SIGNS: Vitals:   11/27/20 1744  BP: (!) 146/71  Pulse: 67  Resp: 16  Temp: 98 F (36.7 C)  SpO2: 98%     Constitutional: Alert and oriented. Well appearing and in no acute distress.  Conversational in full sentences. Eyes: Conjunctivae are normal. PERRL. EOMI. Head: Atraumatic. Nose: No congestion/rhinnorhea. Mouth/Throat: Mucous membranes are moist.  Oropharynx non-erythematous. Neck: No stridor. No cervical spine tenderness to palpation. Cardiovascular: Normal rate, regular rhythm. Grossly normal heart sounds.  Good peripheral circulation. Respiratory: Normal respiratory effort.  No retractions. Lungs CTAB. Gastrointestinal: Soft , nondistended, nontender to palpation. No CVA tenderness. Musculoskeletal: Tenderness to palpation over her left hip and with logrolling the left lower extremity.  Left leg is distally neurovascularly intact. Palpation of other 3 extremities no evidence of deformity, trauma or tenderness. Neurologic:  Normal speech and language. No gross focal neurologic deficits are appreciated. Skin:  Skin is warm, dry and intact. No rash noted. Psychiatric: Mood and  affect are normal. Speech and behavior are normal.  ____________________________________________   LABS (all labs ordered are listed, but only abnormal results are displayed)  Labs Reviewed  CBC WITH DIFFERENTIAL/PLATELET - Abnormal; Notable for the following components:      Result Value   Hemoglobin 11.6 (*)    nRBC 0.6 (*)    All other components within normal limits  RESP PANEL BY RT-PCR (FLU A&B, COVID) ARPGX2  COMPREHENSIVE METABOLIC PANEL  PROTIME-INR  TYPE AND SCREEN   ____________________________________________  12 Lead EKG  Sinus rhythm, rate of 72 bpm.  Normal axis.  Normal intervals.  T wave inversions to aVL.  No further evidence of acute ischemia or STEMI criteria. ____________________________________________  RADIOLOGY  ED MD interpretation: MRI of the left hip performed as an outpatient today reviewed by me with evidence of a left-sided intertrochanteric femur fracture  Official radiology report(s): MR HIP LEFT WO CONTRAST  Result Date: 11/27/2020 CLINICAL DATA:  Left hip pain after fall 3 days ago EXAM: MR OF THE LEFT HIP WITHOUT CONTRAST TECHNIQUE: Multiplanar, multisequence MR imaging was performed. No intravenous contrast was administered. COMPARISON:  X-ray 11/24/2020 FINDINGS: Bones/Joint/Cartilage Acute nondisplaced intertrochanteric fracture of the proximal  left femur with extensive surrounding bone marrow edema (series 4, image 19). No angulation. No fracture involvement of the femoral head. There is periosteal edema surrounding the subtrochanteric aspect of the [facility] ir without evidence of fracture extension distally. No additional fractures. No dislocation. No femoral head avascular necrosis. Mild degenerative changes of the bilateral hip joints. Small left hip joint effusion, likely reactive. Lower lumbar degenerative disc disease. Ligaments Intact. Muscles and Tendons Partial tear of the left hamstring tendon origin (series 3, image 16).  Partial-thickness insertional tear of the left gluteus minimus tendon (series 3, image 24). The remaining tendinous structures are within normal limits. There is intramuscular edema surrounding the proximal left hip and thigh, likely reactive/posttraumatic. Soft tissues No soft tissue fluid collection or hematoma. No inguinal lymphadenopathy. Sigmoid diverticulosis. No acute findings within the visualized pelvis. IMPRESSION: 1. Acute nondisplaced intertrochanteric fracture of the proximal left femur with extensive surrounding bone marrow edema. 2. Partial tear of the left hamstring tendon origin and left gluteus minimus tendon insertion. These results will be called to the ordering clinician or representative by the Radiologist Assistant, and communication documented in the PACS or [employer]. Electronically Signed   By: Davina Poke D.O.   On: 11/27/2020 15:49    ____________________________________________   PROCEDURES and INTERVENTIONS  Procedure(s) performed (including Critical Care):  Procedures  Medications  morphine 2 MG/ML injection 2 mg (2 mg Intravenous Given 11/27/20 1835)    ____________________________________________   MDM / ED COURSE   84 year old woman presents with known left intratrochanteric femur fracture, without evidence of additional acute pathology, requiring hospitalist admission for orthopedic fixation tomorrow.  Normal vitals on room air.  Exam with expected tenderness over her left hip, without evidence of neurovascular deficits or additional trauma.  She is in no distress, but is reporting 10/10 left hip pain.  MRI performed as an outpatient reviewed with evidence of a nondisplaced fracture.  Spoke with orthopedics, who plans to take the patient to the OR tomorrow.  Will admit the patient to hospitalist medicine for maintenance of her chronic conditions, further work-up and management.   Clinical Course as of 11/27/20 Margit Banda Nov 27, 2020  1807 Spoke  with Dr. Posey Pronto who is already aware of the patient and planning for procedure tomorrow.  He requests INR and hospitalist admission. [DS]    Clinical Course User Index [DS] Vladimir Crofts, MD    ____________________________________________   FINAL CLINICAL IMPRESSION(S) / ED DIAGNOSES  Final diagnoses:  Closed nondisplaced intertrochanteric fracture of left femur, initial encounter ([place])  Left hip pain  Anticoagulated     ED Discharge Orders    None       Susanna Benge   Note:  This document was prepared using Dragon voice recognition software and may include unintentional dictation errors.   Vladimir Crofts, MD 11/27/20 Bosie Helper

## 2020-11-27 NOTE — ED Notes (Signed)
Took over care of pt. Pt resting comfortably and has no complaints at this time. Pt denies pain. VSS. Pt in NAD at this time. Awaiting further orders. Will continue to monitor.

## 2020-11-27 NOTE — ED Triage Notes (Signed)
Patient sent to ER post MRI + for left hip fracture. Patient reports she fell 3 days ago. Had negative xray at pcp yesterday. Patient A&OX3.

## 2020-11-27 NOTE — H&P (Signed)
History and Physical    Katelyn Lee QIO:[id number] DOB: 1936-05-08 DOA: 11/27/2020  PCP: Sofie Hartigan, MD  Patient coming from: Home  I have personally briefly reviewed patient's old medical records in [place]  Chief Complaint: Left hip pain  HPI: [name] is a 84 y.o. female with medical history significant for paroxysmal atrial fibrillation on Xarelto, history of CVA, and hyperlipidemia who presents to the ED for evaluation of left hip pain.  Patient states she fell approximately 10 days ago when she was misstepped when walking down a few steps.  She landed on her left hip and was having significant pain since.  She said she had been trying to manage at home even though was very difficult for her to ambulate around the house.  She was seen in urgent care 11/24/2020 at which time left hip x-ray showed mild osteoarthritis with no acute abnormality.  She followed up with orthopedics as an outpatient yesterday and had MRI ordered for further evaluation.  MRI of the left hip was obtained earlier today and showed an acute nondisplaced intertrochanteric fracture of the proximal left femur with surrounding bone marrow edema.  Partial tear of the left hamstring tendon origin and left gluteus minimus tendon insertion also were noted.  She was brought directly to the ED afterwards for further evaluation.  Patient states that pain is improving after receiving medications while in the ED.  She denies any recent chest pain, palpitations, dyspnea, cough, nausea, vomiting, abdominal pain, dysuria.  She says she does take Xarelto with last dose taken morning of 11/27/2020.  She has not seen any obvious bleeding.  ED Course:  Initial vitals showed BP 146/71, pulse 67, RR 16, temp 98.0 F, SPO2 98% on room air.  Labs show sodium 136, potassium 4.8, bicarb 24, BUN 21, creatinine 1.00, serum glucose 98, LFTs within normal limits, WBC 7.2, hemoglobin 11.6, platelets 222,000.  INR is ordered  and pending.  SARS-CoV-2 PCR panel was collected and pending.  Portable chest x-ray is negative for focal consolidation, edema, or effusion.  Patient was given IV morphine 2 mg.  EDP discussed with orthopedics who recommended medical admission with plan for surgical fixation tomorrow.  The hospitalist service was consulted to admit for further evaluation and management.  Review of Systems: All systems reviewed and are negative except as documented in history of present illness above.   Past Medical History:  Diagnosis Date  . A-fib ([place])   . Arthritis    legs, lower back  . Dyspnea   . Dysrhythmia    A-FIB  . Goiter   . Insomnia   . Stroke ([place]) 07/29/2017  . Vertigo   . Wears dentures    full upper and lower  . Wears hearing aid in both ears     Past Surgical History:  Procedure Laterality Date  . CATARACT EXTRACTION W/PHACO Left 01/21/2020   Procedure: CATARACT EXTRACTION PHACO AND INTRAOCULAR LENS PLACEMENT (IOC) LEFT 8.97  00:57.9;  Surgeon: Eulogio Bear, MD;  Location: [place];  Service: Ophthalmology;  Laterality: Left;  . CATARACT EXTRACTION W/PHACO Right 03/10/2020   Procedure: CATARACT EXTRACTION PHACO AND INTRAOCULAR LENS PLACEMENT (IOC) RIGHT 11.55  01:10.2;  Surgeon: Eulogio Bear, MD;  Location: [place];  Service: Ophthalmology;  Laterality: Right;  . CHOLECYSTECTOMY    . COLONOSCOPY    . COLONOSCOPY WITH PROPOFOL N/A 06/28/2017   Procedure: COLONOSCOPY WITH PROPOFOL;  Surgeon: Lollie Sails, MD;  Location:  [place] ENDOSCOPY;  Service: Endoscopy;  Laterality: N/A;  . HERNIA REPAIR    . TONSILLECTOMY      Social History:  reports that she has never smoked. She has never used smokeless tobacco. She reports that she does not drink alcohol and does not use drugs.  Allergies  Allergen Reactions  . Fludrocortisone Other (See Comments)    Pt had a spike in her Blood pressure    . Coumadin [Warfarin Sodium] Rash    Family  History  Problem Relation Age of Onset  . Depression Mother   . Heart disease Father      Prior to Admission medications   Medication Sig Start Date End Date Taking? Authorizing Provider  celecoxib (CELEBREX) 100 MG capsule Take 100 mg by mouth daily as needed (pain.). 11/17/20   [provider]  rivaroxaban (XARELTO) 20 MG TABS tablet Take 20 mg by mouth daily after breakfast. 05/03/19   [provider]  rosuvastatin (CRESTOR) 5 MG tablet Take 5 mg by mouth daily after breakfast. 10/28/20   [provider]  sertraline (ZOLOFT) 100 MG tablet Take 150 mg by mouth daily after breakfast. 08/08/19   [provider]  traMADol (ULTRAM) 50 MG tablet Take 1 tablet (50 mg total) by mouth every 8 (eight) hours as needed for up to 5 days. 11/24/20 11/29/20  Danton Clap, PA-C  apixaban (ELIQUIS) 5 MG TABS tablet Take 5 mg by mouth 2 (two) times daily.  09/29/19  [provider]  gabapentin (NEURONTIN) 300 MG capsule Take 300 mg by mouth at bedtime.  09/29/19  [provider]  ranitidine (ZANTAC) 75 MG tablet Take 1 tablet (75 mg total) by mouth 2 (two) times daily. 05/22/16 09/29/19  Schaevitz, Randall An, MD  zolpidem (AMBIEN) 5 MG tablet Take 5 mg by mouth at bedtime as needed for sleep.  09/29/19  [provider]    Physical Exam: Vitals:   11/27/20 1744 11/27/20 1745  BP: (!) 146/71   Pulse: 67   Resp: 16   Temp: 98 F (36.7 C)   TempSrc: Oral   SpO2: 98%   Weight:  83 kg  Height:  5\' 8"  (1.727 m)   Constitutional: Elderly woman resting in bed, NAD, calm, comfortable Eyes: PERRL, lids and conjunctivae normal ENMT: Mucous membranes are moist. Posterior pharynx clear of any exudate or lesions.Normal dentition.  Neck: normal, supple, no masses. Respiratory: clear to auscultation anteriorly.  Normal respiratory effort. No accessory muscle use.  Cardiovascular: Regular rate and rhythm, no murmurs / rubs / gallops. No extremity  edema. 2+ pedal pulses. Abdomen: no tenderness, no masses palpated. No hepatosplenomegaly. Bowel sounds positive.  Musculoskeletal: no clubbing / cyanosis.  ROM at left hip diminished due to fracture/pain otherwise intact all other extremities.  Plantar flexion/dorsiflexion intact at both ankles. Skin: no rashes, lesions, ulcers. No induration Neurologic: CN 2-12 grossly intact. Sensation intact, Strength diminished at left hip due to fracture/pain otherwise intact. Psychiatric: Normal judgment and insight. Alert and oriented x 3. Normal mood.   Labs on Admission: I have personally reviewed following labs and imaging studies  CBC: Recent Labs  Lab 11/27/20 1813  WBC 7.2  NEUTROABS 4.8  HGB 11.6*  HCT 36.6  MCV 93.1  PLT 161   Basic Metabolic Panel: Recent Labs  Lab 11/27/20 1813  NA 136  K 4.8  CL 104  CO2 24  GLUCOSE 98  BUN 21  CREATININE 1.00  CALCIUM 8.8*   GFR: Estimated Creatinine  Clearance: 49 mL/min (by C-G formula based on SCr of 1 mg/dL). Liver Function Tests: Recent Labs  Lab 11/27/20 1813  AST 28  ALT 19  ALKPHOS 75  BILITOT 0.7  PROT 6.8  ALBUMIN 3.6   No results for input(s): LIPASE, AMYLASE in the last 168 hours. No results for input(s): AMMONIA in the last 168 hours. Coagulation Profile: No results for input(s): INR, PROTIME in the last 168 hours. Cardiac Enzymes: No results for input(s): CKTOTAL, CKMB, CKMBINDEX, TROPONINI in the last 168 hours. BNP (last 3 results) No results for input(s): PROBNP in the last 8760 hours. HbA1C: No results for input(s): HGBA1C in the last 72 hours. CBG: No results for input(s): GLUCAP in the last 168 hours. Lipid Profile: No results for input(s): CHOL, HDL, LDLCALC, TRIG, CHOLHDL, LDLDIRECT in the last 72 hours. Thyroid Function Tests: No results for input(s): TSH, T4TOTAL, FREET4, T3FREE, THYROIDAB in the last 72 hours. Anemia Panel: No results for input(s): VITAMINB12, FOLATE, FERRITIN, TIBC, IRON,  RETICCTPCT in the last 72 hours. Urine analysis:    Component Value Date/Time   COLORURINE YELLOW (A) 03/23/2020 1617   APPEARANCEUR HAZY (A) 03/23/2020 1617   APPEARANCEUR Clear 10/03/2014 1025   LABSPEC 1.025 03/23/2020 1617   LABSPEC 1.017 10/03/2014 1025   PHURINE 5.0 03/23/2020 1617   GLUCOSEU NEGATIVE 03/23/2020 1617   GLUCOSEU Negative 10/03/2014 1025   HGBUR NEGATIVE 03/23/2020 1617   BILIRUBINUR NEGATIVE 03/23/2020 1617   BILIRUBINUR Negative 10/03/2014 1025   KETONESUR NEGATIVE 03/23/2020 1617   PROTEINUR NEGATIVE 03/23/2020 1617   NITRITE NEGATIVE 03/23/2020 1617   LEUKOCYTESUR MODERATE (A) 03/23/2020 1617   LEUKOCYTESUR Negative 10/03/2014 1025    Radiological Exams on Admission: MR HIP LEFT WO CONTRAST  Result Date: 11/27/2020 CLINICAL DATA:  Left hip pain after fall 3 days ago EXAM: MR OF THE LEFT HIP WITHOUT CONTRAST TECHNIQUE: Multiplanar, multisequence MR imaging was performed. No intravenous contrast was administered. COMPARISON:  X-ray 11/24/2020 FINDINGS: Bones/Joint/Cartilage Acute nondisplaced intertrochanteric fracture of the proximal left femur with extensive surrounding bone marrow edema (series 4, image 19). No angulation. No fracture involvement of the femoral head. There is periosteal edema surrounding the subtrochanteric aspect of the [facility] ir without evidence of fracture extension distally. No additional fractures. No dislocation. No femoral head avascular necrosis. Mild degenerative changes of the bilateral hip joints. Small left hip joint effusion, likely reactive. Lower lumbar degenerative disc disease. Ligaments Intact. Muscles and Tendons Partial tear of the left hamstring tendon origin (series 3, image 16). Partial-thickness insertional tear of the left gluteus minimus tendon (series 3, image 24). The remaining tendinous structures are within normal limits. There is intramuscular edema surrounding the proximal left hip and thigh, likely  reactive/posttraumatic. Soft tissues No soft tissue fluid collection or hematoma. No inguinal lymphadenopathy. Sigmoid diverticulosis. No acute findings within the visualized pelvis. IMPRESSION: 1. Acute nondisplaced intertrochanteric fracture of the proximal left femur with extensive surrounding bone marrow edema. 2. Partial tear of the left hamstring tendon origin and left gluteus minimus tendon insertion. These results will be called to the ordering clinician or representative by the Radiologist Assistant, and communication documented in the PACS or [employer]. Electronically Signed   By: Davina Poke D.O.   On: 11/27/2020 15:49    EKG: Personally reviewed. Sinus rhythm with PAC.  PAC is new when compared to prior.  Assessment/Plan Principal Problem:   Closed nondisplaced intertrochanteric fracture of left femur, initial encounter [facility]) Active Problems:   History of stroke  Paroxysmal A-fib (HCC)   Mixed hyperlipidemia  JERE VANBUREN is a 84 y.o. female with medical history significant for paroxysmal atrial fibrillation on Xarelto, history of CVA, and hyperlipidemia who is admitted with left hip fracture.  Intratrochanteric fracture of proximal left femur: Occurring after mechanical fall 10 days prior to admit.  Seen on MRI 11/27/2020.  Partial tear of the left hamstring tendon origin and left gluteus medius tendon insertion also seen. -Orthopedics consulted and plan for surgical fixation tomorrow -Keep n.p.o. after midnight -Hold home Xarelto -Pain control with analgesics as needed  Paroxysmal atrial fibrillation: In sinus rhythm on admission.  Xarelto on hold as above.  Not requiring rate/rhythm controlling medications as an outpatient.  History of stroke: Xarelto on hold as above.  Continue statin.  Hyperlipidemia: Continue rosuvastatin.  GAD: Continue sertraline.  DVT prophylaxis: SCDs Code Status: DNR, confirmed with patient Family Communication: Discussed with  patient, she has discussed with family Disposition Plan: From home, dispo pending surgical intervention and postop PT/OT eval Consults called: Orthopedics Admission status:  Status is: Inpatient  Remains inpatient appropriate because:Unsafe d/c plan and Inpatient level of care appropriate due to severity of illness   Dispo: The patient is from: Home              Anticipated d/c is to: Home with home health versus SNF              Anticipated d/c date is: 2 days              Patient currently is not medically stable to d/c.   Zada Finders MD [facility]  If 7PM-7AM, please contact night-coverage [email or web address]  11/27/2020, 6:50 PM

## 2020-11-27 NOTE — Progress Notes (Signed)
Full consult note and discussion with patient to follow tomorrow AM.  Called by ED staff. Imaging reviewed.  - Plan for surgery tomorrow, likely afternoon.  - NPO after midnight - Hold anticoagulation - Admit to Hospitalist team.

## 2020-11-27 NOTE — ED Notes (Signed)
Santiago Glad daughter would like to be called if pt goes to surgery or change in pt status [phone].

## 2020-11-28 ENCOUNTER — Encounter
Admission: EM | Disposition: A | Discharge status: Skilled Nursing Facility | Payer: Self-pay | Source: Home / Self Care | Attending: Internal Medicine

## 2020-11-28 ENCOUNTER — Inpatient Hospital Stay: Payer: Medicare Other

## 2020-11-28 ENCOUNTER — Inpatient Hospital Stay: Admission: RE | Admit: 2020-11-28 | Payer: Medicare Other | Source: Home / Self Care | Admitting: Orthopedic Surgery

## 2020-11-28 ENCOUNTER — Inpatient Hospital Stay: Payer: Medicare Other | Admitting: Anesthesiology

## 2020-11-28 ENCOUNTER — Encounter: Payer: Self-pay | Admitting: Internal Medicine

## 2020-11-28 DIAGNOSIS — Z8673 Personal history of transient ischemic attack (TIA), and cerebral infarction without residual deficits: Secondary | ICD-10-CM

## 2020-11-28 HISTORY — PX: INTRAMEDULLARY (IM) NAIL INTERTROCHANTERIC: SHX5875

## 2020-11-28 LAB — CBC
HCT: 33.7 % — ABNORMAL LOW (ref 36.0–46.0)
Hemoglobin: 11 g/dL — ABNORMAL LOW (ref 12.0–15.0)
MCH: 29.6 pg (ref 26.0–34.0)
MCHC: 32.6 g/dL (ref 30.0–36.0)
MCV: 90.8 fL (ref 80.0–100.0)
Platelets: 229 10*3/uL (ref 150–400)
RBC: 3.71 MIL/uL — ABNORMAL LOW (ref 3.87–5.11)
RDW: 13.6 % (ref 11.5–15.5)
WBC: 4.7 10*3/uL (ref 4.0–10.5)
nRBC: 0 % (ref 0.0–0.2)

## 2020-11-28 LAB — BASIC METABOLIC PANEL
Anion gap: 9 (ref 5–15)
BUN: 18 mg/dL (ref 8–23)
CO2: 25 mmol/L (ref 22–32)
Calcium: 8.9 mg/dL (ref 8.9–10.3)
Chloride: 105 mmol/L (ref 98–111)
Creatinine, Ser: 0.87 mg/dL (ref 0.44–1.00)
GFR, Estimated: >60 mL/min (ref >60)
Glucose, Bld: 94 mg/dL (ref 70–99)
Potassium: 4.5 mmol/L (ref 3.5–5.1)
Sodium: 139 mmol/L (ref 135–145)

## 2020-11-28 LAB — VITAMIN D 25 HYDROXY (VIT D DEFICIENCY, FRACTURES): Vit D, 25-Hydroxy: 21.57 ng/mL — ABNORMAL LOW (ref 30–100)

## 2020-11-28 SURGERY — FIXATION, FRACTURE, INTERTROCHANTERIC, WITH INTRAMEDULLARY ROD
Anesthesia: General | Site: Hip | Laterality: Left

## 2020-11-28 MED ORDER — FENTANYL CITRATE (PF) 100 MCG/2ML IJ SOLN
INTRAMUSCULAR | Status: DC | PRN
  Administered 2020-11-28 (×2): 50 ug via INTRAVENOUS

## 2020-11-28 MED ORDER — OXYCODONE HCL 5 MG PO TABS
2.5000 mg | ORAL_TABLET | ORAL | Status: DC | PRN
  Administered 2020-11-28 – 2020-11-29 (×2): 5 mg via ORAL

## 2020-11-28 MED ORDER — LIDOCAINE HCL (CARDIAC) PF 100 MG/5ML IV SOSY
PREFILLED_SYRINGE | INTRAVENOUS | Status: DC | PRN
  Administered 2020-11-28: 60 mg via INTRAVENOUS

## 2020-11-28 MED ORDER — HYDROMORPHONE HCL 1 MG/ML IJ SOLN: 0.2500 mg | INTRAMUSCULAR | Status: DC | PRN

## 2020-11-28 MED ORDER — LACTATED RINGERS IV SOLN: INTRAVENOUS | Status: DC | PRN

## 2020-11-28 MED ORDER — BUPIVACAINE HCL (PF) 0.5 % IJ SOLN
INTRAMUSCULAR | Status: AC
  Filled 2020-11-28: qty 30

## 2020-11-28 MED ORDER — METOCLOPRAMIDE HCL 10 MG PO TABS
5.0000 mg | ORAL_TABLET | Freq: Three times a day (TID) | ORAL | Status: DC | PRN
Start: 2020-11-28 — End: 2020-12-03

## 2020-11-28 MED ORDER — ONDANSETRON HCL 4 MG/2ML IJ SOLN: 4.0000 mg | Freq: Once | INTRAMUSCULAR | Status: DC | PRN

## 2020-11-28 MED ORDER — LIDOCAINE HCL (PF) 1 % IJ SOLN
INTRAMUSCULAR | Status: AC
  Filled 2020-11-28: qty 5

## 2020-11-28 MED ORDER — FLEET ENEMA 7-19 GM/118ML RE ENEM: 1.0000 | ENEMA | Freq: Once | RECTAL | Status: DC | PRN

## 2020-11-28 MED ORDER — SODIUM CHLORIDE 0.9 % IV SOLN
INTRAVENOUS | Status: DC | PRN
  Administered 2020-11-28: 50 mL/h via INTRAVENOUS

## 2020-11-28 MED ORDER — BUPIVACAINE HCL (PF) 0.5 % IJ SOLN
INTRAMUSCULAR | Status: DC | PRN
  Administered 2020-11-28: 30 mL

## 2020-11-28 MED ORDER — PHENYLEPHRINE HCL (PRESSORS) 10 MG/ML IV SOLN
INTRAVENOUS | Status: DC | PRN
  Administered 2020-11-28 (×3): 100 ug via INTRAVENOUS

## 2020-11-28 MED ORDER — ROCURONIUM BROMIDE 100 MG/10ML IV SOLN
INTRAVENOUS | Status: DC | PRN
  Administered 2020-11-28: 50 mg via INTRAVENOUS

## 2020-11-28 MED ORDER — PROPOFOL 10 MG/ML IV BOLUS
INTRAVENOUS | Status: DC | PRN
  Administered 2020-11-28: 60 mg via INTRAVENOUS
  Administered 2020-11-28: 40 mg via INTRAVENOUS
  Administered 2020-11-28: 50 mg via INTRAVENOUS

## 2020-11-28 MED ORDER — VITAMIN D 25 MCG (1000 UNIT) PO TABS
1000.0000 [IU] | ORAL_TABLET | Freq: Every day | ORAL | Status: DC
  Administered 2020-11-29 – 2020-12-03 (×5): 1000 [IU] via ORAL
  Filled 2020-11-28 (×5): qty 1

## 2020-11-28 MED ORDER — ONDANSETRON HCL 4 MG/2ML IJ SOLN
INTRAMUSCULAR | Status: DC | PRN
  Administered 2020-11-28: 4 mg via INTRAVENOUS

## 2020-11-28 MED ORDER — OXYCODONE HCL 5 MG PO TABS
ORAL_TABLET | ORAL | Status: AC
  Filled 2020-11-28: qty 1

## 2020-11-28 MED ORDER — METHOCARBAMOL 1000 MG/10ML IJ SOLN
500.0000 mg | Freq: Four times a day (QID) | INTRAVENOUS | Status: DC | PRN
  Filled 2020-11-28: qty 5

## 2020-11-28 MED ORDER — DEXAMETHASONE SODIUM PHOSPHATE 10 MG/ML IJ SOLN
INTRAMUSCULAR | Status: DC | PRN
  Administered 2020-11-28: 10 mg via INTRAVENOUS

## 2020-11-28 MED ORDER — SUGAMMADEX SODIUM 200 MG/2ML IV SOLN
INTRAVENOUS | Status: DC | PRN
  Administered 2020-11-28: 200 mg via INTRAVENOUS

## 2020-11-28 MED ORDER — CEFAZOLIN SODIUM-DEXTROSE 2-4 GM/100ML-% IV SOLN
2.0000 g | Freq: Four times a day (QID) | INTRAVENOUS | Status: AC
  Administered 2020-11-28 – 2020-11-29 (×3): 2 g via INTRAVENOUS
  Filled 2020-11-28 (×3): qty 100

## 2020-11-28 MED ORDER — ONDANSETRON HCL 4 MG PO TABS: 4.0000 mg | ORAL_TABLET | Freq: Four times a day (QID) | ORAL | Status: DC | PRN

## 2020-11-28 MED ORDER — FENTANYL CITRATE (PF) 100 MCG/2ML IJ SOLN
INTRAMUSCULAR | Status: AC
  Administered 2020-11-28: 25 ug via INTRAVENOUS
  Filled 2020-11-28: qty 2

## 2020-11-28 MED ORDER — METHOCARBAMOL 500 MG PO TABS
500.0000 mg | ORAL_TABLET | Freq: Four times a day (QID) | ORAL | Status: DC | PRN
  Administered 2020-11-29 – 2020-12-02 (×3): 500 mg via ORAL
  Filled 2020-11-28 (×3): qty 1

## 2020-11-28 MED ORDER — SODIUM CHLORIDE 0.9 % IR SOLN
Status: DC | PRN
  Administered 2020-11-28: 500 mL

## 2020-11-28 MED ORDER — ONDANSETRON HCL 4 MG/2ML IJ SOLN: 4.0000 mg | Freq: Four times a day (QID) | INTRAMUSCULAR | Status: DC | PRN

## 2020-11-28 MED ORDER — OXYCODONE HCL 5 MG PO TABS
5.0000 mg | ORAL_TABLET | ORAL | Status: DC | PRN
  Administered 2020-11-30: 10 mg via ORAL
  Administered 2020-11-30: 5 mg via ORAL
  Filled 2020-11-28: qty 1
  Filled 2020-11-28 (×2): qty 2

## 2020-11-28 MED ORDER — INFLUENZA VAC A&B SA ADJ QUAD 0.5 ML IM PRSY
0.5000 mL | PREFILLED_SYRINGE | INTRAMUSCULAR | Status: AC
  Administered 2020-11-29: 09:00:00 0.5 mL via INTRAMUSCULAR
  Filled 2020-11-28: qty 0.5

## 2020-11-28 MED ORDER — BISACODYL 10 MG RE SUPP
10.0000 mg | Freq: Every day | RECTAL | Status: DC | PRN
  Administered 2020-12-01: 13:00:00 10 mg via RECTAL
  Filled 2020-11-28: qty 1

## 2020-11-28 MED ORDER — ACETAMINOPHEN 500 MG PO TABS
1000.0000 mg | ORAL_TABLET | Freq: Three times a day (TID) | ORAL | Status: AC
  Administered 2020-11-28 – 2020-11-29 (×3): 1000 mg via ORAL
  Filled 2020-11-28 (×3): qty 2

## 2020-11-28 MED ORDER — VASOPRESSIN 20 UNIT/ML IV SOLN
INTRAVENOUS | Status: DC | PRN
  Administered 2020-11-28: 2 [IU] via INTRAVENOUS
  Administered 2020-11-28: 1 [IU] via INTRAVENOUS
  Administered 2020-11-28: 2 [IU] via INTRAVENOUS

## 2020-11-28 MED ORDER — DOCUSATE SODIUM 100 MG PO CAPS
100.0000 mg | ORAL_CAPSULE | Freq: Two times a day (BID) | ORAL | Status: DC
  Administered 2020-11-28 – 2020-12-02 (×8): 100 mg via ORAL
  Filled 2020-11-28 (×10): qty 1

## 2020-11-28 MED ORDER — BUPIVACAINE LIPOSOME 1.3 % IJ SUSP
INTRAMUSCULAR | Status: AC
  Filled 2020-11-28: qty 20

## 2020-11-28 MED ORDER — FENTANYL CITRATE (PF) 100 MCG/2ML IJ SOLN
INTRAMUSCULAR | Status: AC
  Administered 2020-11-28: 50 ug via INTRAVENOUS
  Filled 2020-11-28: qty 2

## 2020-11-28 MED ORDER — BUPIVACAINE LIPOSOME 1.3 % IJ SUSP
INTRAMUSCULAR | Status: DC | PRN
  Administered 2020-11-28: 20 mL

## 2020-11-28 MED ORDER — KETOROLAC TROMETHAMINE 15 MG/ML IJ SOLN
7.5000 mg | Freq: Four times a day (QID) | INTRAMUSCULAR | Status: AC
  Administered 2020-11-28 – 2020-11-29 (×3): 7.5 mg via INTRAVENOUS
  Filled 2020-11-28 (×3): qty 1

## 2020-11-28 MED ORDER — RIVAROXABAN 20 MG PO TABS
20.0000 mg | ORAL_TABLET | Freq: Every day | ORAL | Status: DC
  Administered 2020-11-29 – 2020-12-03 (×5): 20 mg via ORAL
  Filled 2020-11-28 (×6): qty 1

## 2020-11-28 MED ORDER — FENTANYL CITRATE (PF) 100 MCG/2ML IJ SOLN
25.0000 ug | INTRAMUSCULAR | Status: DC | PRN
  Administered 2020-11-28 (×3): 25 ug via INTRAVENOUS

## 2020-11-28 MED ORDER — METOCLOPRAMIDE HCL 5 MG/ML IJ SOLN
5.0000 mg | Freq: Three times a day (TID) | INTRAMUSCULAR | Status: DC | PRN
Start: 2020-11-28 — End: 2020-12-03

## 2020-11-28 MED ORDER — EPHEDRINE SULFATE 50 MG/ML IJ SOLN
INTRAMUSCULAR | Status: DC | PRN
  Administered 2020-11-28 (×2): 10 mg via INTRAVENOUS

## 2020-11-28 MED ORDER — SENNOSIDES-DOCUSATE SODIUM 8.6-50 MG PO TABS: 1.0000 | ORAL_TABLET | Freq: Every evening | ORAL | Status: DC | PRN

## 2020-11-28 MED ORDER — TRAMADOL HCL 50 MG PO TABS
50.0000 mg | ORAL_TABLET | Freq: Four times a day (QID) | ORAL | Status: DC | PRN
  Administered 2020-11-28: 50 mg via ORAL
  Filled 2020-11-28: qty 1

## 2020-11-28 MED ORDER — SODIUM CHLORIDE 0.9 % IV SOLN: INTRAVENOUS | Status: DC

## 2020-11-28 SURGICAL SUPPLY — 49 items
BIT DRILL LONG 4.0 (BIT) ×1 IMPLANT
BLADE SURG 15 STRL LF DISP TIS (BLADE) ×1 IMPLANT
BLADE SURG 15 STRL SS (BLADE) ×2
BNDG CONFORM 2 STRL LF (GAUZE/BANDAGES/DRESSINGS) ×3 IMPLANT
CANISTER SUCT 1200ML W/VALVE (MISCELLANEOUS) ×3 IMPLANT
CHLORAPREP W/TINT 26 (MISCELLANEOUS) ×3 IMPLANT
COVER WAND RF STERILE (DRAPES) ×3 IMPLANT
DRAPE 3/4 80X56 (DRAPES) ×3 IMPLANT
DRAPE SURG 17X11 SM STRL (DRAPES) ×6 IMPLANT
DRAPE U-SHAPE 47X51 STRL (DRAPES) ×6 IMPLANT
DRILL BIT LONG 4.0 (BIT) ×3
DRSG OPSITE POSTOP 3X4 (GAUZE/BANDAGES/DRESSINGS) ×12 IMPLANT
DRSG OPSITE POSTOP 4X6 (GAUZE/BANDAGES/DRESSINGS) ×3 IMPLANT
ELECT REM PT RETURN 9FT ADLT (ELECTROSURGICAL) ×3
ELECTRODE REM PT RTRN 9FT ADLT (ELECTROSURGICAL) ×1 IMPLANT
GAUZE SPONGE 4X4 12PLY STRL (GAUZE/BANDAGES/DRESSINGS) ×3 IMPLANT
GLOVE BIOGEL PI IND STRL 8 (GLOVE) ×1 IMPLANT
GLOVE BIOGEL PI INDICATOR 8 (GLOVE) ×2
GLOVE SURG SYN 7.5  E (GLOVE) ×2
GLOVE SURG SYN 7.5 E (GLOVE) ×1 IMPLANT
GOWN STRL REUS W/ TWL LRG LVL3 (GOWN DISPOSABLE) ×1 IMPLANT
GOWN STRL REUS W/ TWL XL LVL3 (GOWN DISPOSABLE) ×1 IMPLANT
GOWN STRL REUS W/TWL LRG LVL3 (GOWN DISPOSABLE) ×2
GOWN STRL REUS W/TWL XL LVL3 (GOWN DISPOSABLE) ×2
GUIDE PIN 3.2X343 (PIN) ×2
GUIDE PIN 3.2X343MM (PIN) ×4
GUIDE ROD 3.0 (MISCELLANEOUS) ×6
KIT PATIENT CARE HANA TABLE (KITS) ×3 IMPLANT
KIT TURNOVER KIT A (KITS) ×3 IMPLANT
MANIFOLD NEPTUNE II (INSTRUMENTS) ×3 IMPLANT
MAT ABSORB  FLUID 56X50 GRAY (MISCELLANEOUS) ×4
MAT ABSORB FLUID 56X50 GRAY (MISCELLANEOUS) ×2 IMPLANT
NAIL TRIGEN INTERTAN 10X18CM (Nail) ×3 IMPLANT
NEEDLE FILTER BLUNT 18X 1/2SAF (NEEDLE) ×2
NEEDLE FILTER BLUNT 18X1 1/2 (NEEDLE) ×1 IMPLANT
NEEDLE HYPO 22GX1.5 SAFETY (NEEDLE) ×3 IMPLANT
NS IRRIG 1000ML POUR BTL (IV SOLUTION) ×3 IMPLANT
PACK HIP COMPR (MISCELLANEOUS) ×3 IMPLANT
PENCIL ELECTRO HAND CTR (MISCELLANEOUS) ×3 IMPLANT
PIN GUIDE 3.2X343MM (PIN) ×2 IMPLANT
ROD GUIDE 3.0 (MISCELLANEOUS) ×2 IMPLANT
SCREW LAG COMPR KIT 90/85 (Screw) ×3 IMPLANT
SCREW TRIGEN LOW PROF 5.0X30 (Screw) ×3 IMPLANT
STAPLER SKIN PROX 35W (STAPLE) ×3 IMPLANT
SUT VIC AB 0 CT1 36 (SUTURE) ×3 IMPLANT
SUT VIC AB 2-0 CT2 27 (SUTURE) ×6 IMPLANT
SYR 10ML LL (SYRINGE) ×3 IMPLANT
SYR 30ML LL (SYRINGE) ×3 IMPLANT
TAPE CLOTH 3X10 WHT NS LF (GAUZE/BANDAGES/DRESSINGS) ×6 IMPLANT

## 2020-11-28 NOTE — Anesthesia Preprocedure Evaluation (Signed)
Anesthesia Evaluation  Patient identified by MRN, date of birth, ID band Patient awake    Reviewed: Allergy & Precautions, H&P , NPO status , Patient's Chart, lab work & pertinent test results  History of Anesthesia Complications Negative for: history of anesthetic complications  Airway Mallampati: III  TM Distance: <3 FB Neck ROM: limited    Dental  (+) Poor Dentition, Missing, Upper Dentures, Lower Dentures, Dental Advidsory Given   Pulmonary shortness of breath and with exertion, neg sleep apnea, neg recent URI,           Cardiovascular Exercise Tolerance: Good (-) hypertension(-) angina(-) Past MI and (-) Cardiac Stents + dysrhythmias Atrial Fibrillation (-) Valvular Problems/Murmurs     Neuro/Psych neg Seizures CVA (left-sided weakness), Residual Symptoms negative psych ROS   GI/Hepatic Neg liver ROS, GERD  Controlled,  Endo/Other  negative endocrine ROS  Renal/GU negative Renal ROS  negative genitourinary   Musculoskeletal   Abdominal   Peds  Hematology negative hematology ROS (+)   Anesthesia Other Findings Past Medical History: No date: A-fib (HCC) No date: Dyspnea No date: Dysrhythmia     Comment: A-FIB No date: Goiter No date: Insomnia  Past Surgical History: No date: CHOLECYSTECTOMY No date: COLONOSCOPY No date: HERNIA REPAIR No date: TONSILLECTOMY     Reproductive/Obstetrics negative OB ROS                             Anesthesia Physical  Anesthesia Plan  ASA: III  Anesthesia Plan: General   Post-op Pain Management:    Induction: Intravenous  PONV Risk Score and Plan: 3 and Ondansetron, Dexamethasone and Treatment may vary due to age or medical condition  Airway Management Planned: Oral ETT and LMA  Additional Equipment:   Intra-op Plan:   Post-operative Plan: Extubation in OR  Informed Consent: I have reviewed the patients History and Physical,  chart, labs and discussed the procedure including the risks, benefits and alternatives for the proposed anesthesia with the patient or authorized representative who has indicated his/her understanding and acceptance.     Dental Advisory Given  Plan Discussed with: Anesthesiologist, CRNA and Surgeon  Anesthesia Plan Comments: (Patient consented for risks of anesthesia including but not limited to:  - adverse reactions to medications - damage to teeth, lips or other oral mucosa - sore throat or hoarseness - Damage to heart, brain, lungs or loss of life  Patient voiced understanding.)        Anesthesia Quick Evaluation

## 2020-11-28 NOTE — Anesthesia Procedure Notes (Signed)
Procedure Name: Intubation Date/Time: 11/28/2020 4:03 PM Performed by: Allean Found, CRNA Pre-anesthesia Checklist: Patient identified, Patient being monitored, Timeout performed, Emergency Drugs available and Suction available Patient Re-evaluated:Patient Re-evaluated prior to induction Oxygen Delivery Method: Circle system utilized Preoxygenation: Pre-oxygenation with 100% oxygen Induction Type: IV induction Ventilation: Mask ventilation without difficulty Laryngoscope Size: 3 and McGraph Grade View: Grade I Tube type: Oral Tube size: 7.0 mm Number of attempts: 1 Airway Equipment and Method: Stylet Placement Confirmation: ETT inserted through vocal cords under direct vision,  positive ETCO2 and breath sounds checked- equal and bilateral Secured at: 21 cm Tube secured with: Tape Dental Injury: Teeth and Oropharynx as per pre-operative assessment

## 2020-11-28 NOTE — Consult Note (Signed)
ORTHOPAEDIC CONSULTATION  REQUESTING PHYSICIAN: Lenore Cordia, MD  Chief Complaint:   L hip pain  History of Present Illness: Katelyn Lee is a 84 y.o. female who had a fall ~10 days ago while going down her stairs.  The patient noted immediate hip pain but had still been ambulatory.  The patient ambulates with a walker or cane at baseline.  The patient lives independently and alone at her home. She began to have worsening pain over the past few days. She was in the urgent care for this on 11/24/20 but radiographs were negative. She followed up with Reche Dixon, PA who obtained an MRI. This showed a non-displaced L intertrochanteric femur fracture. Pain is described as severe with weight-bearing, and she is unable to ambulate due to her pain.  Pain is improved with rest and immobilization.    She has paroxysmal atrial fibrillation and is on Xarelto with last dose yesterday AM. She also has a history of prior stroke that affected her L side, but those symptoms have mostly resolved.  Past Medical History:  Diagnosis Date  . A-fib ([place])   . Arthritis    legs, lower back  . Dyspnea   . Dysrhythmia    A-FIB  . Goiter   . Insomnia   . Stroke ([place]) 07/29/2017  . Vertigo   . Wears dentures    full upper and lower  . Wears hearing aid in both ears    Past Surgical History:  Procedure Laterality Date  . CATARACT EXTRACTION W/PHACO Left 01/21/2020   Procedure: CATARACT EXTRACTION PHACO AND INTRAOCULAR LENS PLACEMENT (IOC) LEFT 8.97  00:57.9;  Surgeon: Eulogio Bear, MD;  Location: [place];  Service: Ophthalmology;  Laterality: Left;  . CATARACT EXTRACTION W/PHACO Right 03/10/2020   Procedure: CATARACT EXTRACTION PHACO AND INTRAOCULAR LENS PLACEMENT (IOC) RIGHT 11.55  01:10.2;  Surgeon: Eulogio Bear, MD;  Location: [facility];  Service: Ophthalmology;  Laterality: Right;  . CHOLECYSTECTOMY    .  COLONOSCOPY    . COLONOSCOPY WITH PROPOFOL N/A 06/28/2017   Procedure: COLONOSCOPY WITH PROPOFOL;  Surgeon: Lollie Sails, MD;  Location: [facility] ENDOSCOPY;  Service: Endoscopy;  Laterality: N/A;  . HERNIA REPAIR    . TONSILLECTOMY     Social History   Socioeconomic History  . Marital status: Divorced    Spouse name: Not on file  . Number of children: Not on file  . Years of education: Not on file  . Highest education level: Not on file  Occupational History  . Not on file  Tobacco Use  . Smoking status: Never Smoker  . Smokeless tobacco: Never Used  Vaping Use  . Vaping Use: Never used  Substance and Sexual Activity  . Alcohol use: No  . Drug use: No  . Sexual activity: Not on file  Other Topics Concern  . Not on file  Social History Narrative  . Not on file   Social Determinants of Health   Financial Resource Strain: Not on file  Food Insecurity: Not on file  Transportation Needs: Not on file  Physical Activity: Not on file  Stress: Not on file  Social Connections: Not on file   Family History  Problem Relation Age of Onset  . Depression Mother   . Heart disease Father    Allergies  Allergen Reactions  . Fludrocortisone Other (See Comments)    Pt had a spike in her Blood pressure    . Coumadin [Warfarin Sodium] Rash  Prior to Admission medications   Medication Sig Start Date End Date Taking? Authorizing Provider  rivaroxaban (XARELTO) 20 MG TABS tablet Take 20 mg by mouth daily after breakfast. 05/03/19  Yes [provider]  rosuvastatin (CRESTOR) 5 MG tablet Take 5 mg by mouth daily after breakfast. 10/28/20  Yes [provider]  sertraline (ZOLOFT) 100 MG tablet Take 150 mg by mouth daily after breakfast. 08/08/19  Yes [provider]  celecoxib (CELEBREX) 100 MG capsule Take 100 mg by mouth daily as needed (pain.). Patient not taking: Reported on 11/27/2020 11/17/20   [provider]  traMADol (ULTRAM) 50 MG tablet Take  1 tablet (50 mg total) by mouth every 8 (eight) hours as needed for up to 5 days. Patient not taking: Reported on 11/27/2020 11/24/20 11/29/20  Danton Clap, PA-C  apixaban (ELIQUIS) 5 MG TABS tablet Take 5 mg by mouth 2 (two) times daily.  09/29/19  [provider]  gabapentin (NEURONTIN) 300 MG capsule Take 300 mg by mouth at bedtime.  09/29/19  [provider]  ranitidine (ZANTAC) 75 MG tablet Take 1 tablet (75 mg total) by mouth 2 (two) times daily. 05/22/16 09/29/19  Schaevitz, Randall An, MD  zolpidem (AMBIEN) 5 MG tablet Take 5 mg by mouth at bedtime as needed for sleep.  09/29/19  [provider]   Recent Labs    11/27/20 1813 11/28/20 0513  WBC 7.2 4.7  HGB 11.6* 11.0*  HCT 36.6 33.7*  PLT 222 229  K 4.8 4.5  CL 104 105  CO2 24 25  BUN 21 18  CREATININE 1.00 0.87  GLUCOSE 98 94  CALCIUM 8.8* 8.9  INR 2.7*  --    MR HIP LEFT WO CONTRAST  Result Date: 11/27/2020 CLINICAL DATA:  Left hip pain after fall 3 days ago EXAM: MR OF THE LEFT HIP WITHOUT CONTRAST TECHNIQUE: Multiplanar, multisequence MR imaging was performed. No intravenous contrast was administered. COMPARISON:  X-ray 11/24/2020 FINDINGS: Bones/Joint/Cartilage Acute nondisplaced intertrochanteric fracture of the proximal left femur with extensive surrounding bone marrow edema (series 4, image 19). No angulation. No fracture involvement of the femoral head. There is periosteal edema surrounding the subtrochanteric aspect of the [facility] ir without evidence of fracture extension distally. No additional fractures. No dislocation. No femoral head avascular necrosis. Mild degenerative changes of the bilateral hip joints. Small left hip joint effusion, likely reactive. Lower lumbar degenerative disc disease. Ligaments Intact. Muscles and Tendons Partial tear of the left hamstring tendon origin (series 3, image 16). Partial-thickness insertional tear of the left gluteus minimus tendon (series 3, image  24). The remaining tendinous structures are within normal limits. There is intramuscular edema surrounding the proximal left hip and thigh, likely reactive/posttraumatic. Soft tissues No soft tissue fluid collection or hematoma. No inguinal lymphadenopathy. Sigmoid diverticulosis. No acute findings within the visualized pelvis. IMPRESSION: 1. Acute nondisplaced intertrochanteric fracture of the proximal left femur with extensive surrounding bone marrow edema. 2. Partial tear of the left hamstring tendon origin and left gluteus minimus tendon insertion. These results will be called to the ordering clinician or representative by the Radiologist Assistant, and communication documented in the PACS or [employer]. Electronically Signed   By: Davina Poke D.O.   On: 11/27/2020 15:49   DG Chest Portable 1 View  Result Date: 11/27/2020 CLINICAL DATA:  Preop hip fracture EXAM: PORTABLE CHEST 1 VIEW COMPARISON:  None. FINDINGS: The heart size and mediastinal contours are within normal limits. Aortic knob calcifications are seen.  Both lungs are clear. The visualized skeletal structures are unremarkable. IMPRESSION: No active disease. Electronically Signed   By: Prudencio Pair M.D.   On: 11/27/2020 19:21     Positive ROS: All other systems have been reviewed and were otherwise negative with the exception of those mentioned in the HPI and as above.  Physical Exam: BP 139/72 (BP Location: Right Arm)   Pulse 60   Temp 98.1 F (36.7 C)   Resp 16   Ht 5\' 8"  (1.727 m)   Wt 83 kg   SpO2 98%   BMI 27.82 kg/m  General:  Alert, no acute distress Psychiatric:  Patient is competent for consent with normal mood and affect   Cardiovascular:  No pedal edema, regular rate and rhythm (not in a-fib at this time) Respiratory:  No wheezing, non-labored breathing GI:  Abdomen is soft and non-tender Skin:  No lesions in the area of chief complaint, no erythema Neurologic:  Sensation intact distally, CN grossly  intact Lymphatic:  No axillary or cervical lymphadenopathy  Orthopedic Exam:  LLE: + DF/PF/EHL SILT grossly over foot Foot wwp +axial load, minimal pain with logroll   X-rays:  As above: L intertrochanteric hip fracture  Assessment/Plan: Katelyn Lee is a 84 y.o. female with a L intertrochanteric hip fracture   1. I discussed the various treatment options including both surgical and non-surgical management of the fracture with the patient and/or family (medical PoA). We discussed the high risk of perioperative complications due to patient's age, dementia, and other co-morbidities. After discussion of risks, benefits, and alternatives to surgery, the family and/or patient were in agreement to proceed with surgery. The goals of surgery would be to provide adequate pain relief and allow for mobilization. Plan for surgery is L hip cephalomedullary nailing today, 11/28/2020. 2. NPO until OR 3. Hold anticoagulation in advance of OR      Leim Fabry   11/28/2020 10:33 AM

## 2020-11-28 NOTE — Progress Notes (Signed)
Consent for surgery obtained by this writer and witnessed , then placed on medical chart for Leim Fabry, MD

## 2020-11-28 NOTE — Anesthesia Postprocedure Evaluation (Signed)
Anesthesia Post Note  Patient: Katelyn Lee  Procedure(s) Performed: INTRAMEDULLARY (IM) NAIL INTERTROCHANTRIC (Left Hip)  Patient location during evaluation: PACU Anesthesia Type: General Level of consciousness: awake and alert Pain management: pain level controlled Vital Signs Assessment: post-procedure vital signs reviewed and stable Respiratory status: spontaneous breathing, nonlabored ventilation and respiratory function stable Cardiovascular status: blood pressure returned to baseline and stable Postop Assessment: no apparent nausea or vomiting Anesthetic complications: no   No complications documented.   Last Vitals:  Vitals:   11/28/20 1748 11/28/20 1807  BP: 111/66 (!) 113/98  Pulse: 80 72  Resp: 18 16  Temp: 36.4 C 36.4 C  SpO2: 92% 97%    Last Pain:  Vitals:   11/28/20 1807  TempSrc: Oral  PainSc:                  Tera Mater

## 2020-11-28 NOTE — Progress Notes (Signed)
Progress Note    Katelyn Lee  ZWC:[id number] DOB: 1936/08/05  DOA: 11/27/2020 PCP: Sofie Hartigan, MD      Brief Narrative:    Medical records reviewed and are as summarized below:  Katelyn Lee is a 84 y.o. female       Assessment/Plan:   Principal Problem:   Closed nondisplaced intertrochanteric fracture of left femur, initial encounter ([place]) Active Problems:   History of stroke   Paroxysmal A-fib ([place])   Mixed hyperlipidemia   Body mass index is 27.82 kg/m.   Left intertrochanteric hip fracture: Analgesics as needed for pain.  Plan for left hip surgery today.  She has no acute issues at the moment and is medically optimized for surgery today.  Follow-up with orthopedic surgeon.  Paroxysmal atrial fibrillation and history of stroke: Xarelto has been held because of upcoming surgery.  Vitamin D deficiency: Vitamin D level is 21.57.  Start vitamin D supplement.  Hyperlipidemia: Continue rosuvastatin  Anxiety: She is on sertraline.    Diet Order            Diet NPO time specified  Diet effective midnight                    Consultants:  Orthopedic surgeon, Dr. Leim Fabry  Procedures:  Left hip surgery today    Medications:   . [START ON 11/29/2020] influenza vaccine adjuvanted  0.5 mL Intramuscular Tomorrow-1000  . rosuvastatin  5 mg Oral QPC breakfast  . sertraline  150 mg Oral QPC breakfast   Continuous Infusions: .  ceFAZolin (ANCEF) IV       Anti-infectives (From admission, onward)   Start     Dose/Rate Route Frequency Ordered Stop   11/28/20 0600  ceFAZolin (ANCEF) IVPB 2g/100 mL premix        2 g 200 mL/hr over 30 Minutes Intravenous On call to O.R. 11/27/20 2252 11/29/20 0559             Family Communication/Anticipated D/C date and plan/Code Status   DVT prophylaxis: SCDs Start: 11/27/20 1945     Code Status: DNR  Family Communication: None Disposition Plan:    Status is: Inpatient  Remains  inpatient appropriate because:Unsafe d/c plan and Inpatient level of care appropriate due to severity of illness   Dispo: The patient is from: Home              Anticipated d/c is to: SNF              Anticipated d/c date is: 3 days              Patient currently is not medically stable to d/c.           Subjective:   C/o left hip pain with slight movement.  Objective:    Vitals:   11/27/20 2347 11/28/20 0449 11/28/20 0803 11/28/20 1138  BP: 127/88 136/74 139/72 126/69  Pulse: 70 60 60 62  Resp: 16 15 16 16   Temp: 97.8 F (36.6 C) 98.3 F (36.8 C) 98.1 F (36.7 C) 98.4 F (36.9 C)  TempSrc: Oral Oral    SpO2: 96% 99% 98% 97%  Weight:      Height:       No data found.   Intake/Output Summary (Last 24 hours) at 11/28/2020 1243 Last data filed at 11/28/2020 1140 Gross per 24 hour  Intake --  Output 450 ml  Net -450 ml   Filed  Weights   11/27/20 1745  Weight: 83 kg    Exam:  GEN: NAD SKIN: Warm and dry EYES: No pallor or icterus ENT: MMM CV: RRR PULM: CTA B ABD: soft, ND, NT, +BS CNS: AAO x 3, non focal EXT: Left hip tenderness   Data Reviewed:   I have personally reviewed following labs and imaging studies:  Labs: Labs show the following:   Basic Metabolic Panel: Recent Labs  Lab 11/27/20 1813 11/28/20 0513  NA 136 139  K 4.8 4.5  CL 104 105  CO2 24 25  GLUCOSE 98 94  BUN 21 18  CREATININE 1.00 0.87  CALCIUM 8.8* 8.9   GFR Estimated Creatinine Clearance: 56.3 mL/min (by C-G formula based on SCr of 0.87 mg/dL). Liver Function Tests: Recent Labs  Lab 11/27/20 1813  AST 28  ALT 19  ALKPHOS 75  BILITOT 0.7  PROT 6.8  ALBUMIN 3.6   No results for input(s): LIPASE, AMYLASE in the last 168 hours. No results for input(s): AMMONIA in the last 168 hours. Coagulation profile Recent Labs  Lab 11/27/20 1813  INR 2.7*    CBC: Recent Labs  Lab 11/27/20 1813 11/28/20 0513  WBC 7.2 4.7  NEUTROABS 4.8  --   HGB 11.6*  11.0*  HCT 36.6 33.7*  MCV 93.1 90.8  PLT 222 229   Cardiac Enzymes: No results for input(s): CKTOTAL, CKMB, CKMBINDEX, TROPONINI in the last 168 hours. BNP (last 3 results) No results for input(s): PROBNP in the last 8760 hours. CBG: No results for input(s): GLUCAP in the last 168 hours. D-Dimer: No results for input(s): DDIMER in the last 72 hours. Hgb A1c: No results for input(s): HGBA1C in the last 72 hours. Lipid Profile: No results for input(s): CHOL, HDL, LDLCALC, TRIG, CHOLHDL, LDLDIRECT in the last 72 hours. Thyroid function studies: No results for input(s): TSH, T4TOTAL, T3FREE, THYROIDAB in the last 72 hours.  Invalid input(s): FREET3 Anemia work up: No results for input(s): VITAMINB12, FOLATE, FERRITIN, TIBC, IRON, RETICCTPCT in the last 72 hours. Sepsis Labs: Recent Labs  Lab 11/27/20 1813 11/28/20 0513  WBC 7.2 4.7    Microbiology Recent Results (from the past 240 hour(s))  Resp Panel by RT-PCR (Flu A&B, Covid) Nasopharyngeal Swab     Status: None   Collection Time: 11/27/20  6:13 PM   Specimen: Nasopharyngeal Swab; Nasopharyngeal(NP) swabs in vial transport medium  Result Value Ref Range Status   SARS Coronavirus 2 by RT PCR NEGATIVE NEGATIVE Final    Comment: (NOTE) SARS-CoV-2 target nucleic acids are NOT DETECTED.  The SARS-CoV-2 RNA is generally detectable in upper respiratory specimens during the acute phase of infection. The lowest concentration of SARS-CoV-2 viral copies this assay can detect is 138 copies/mL. A negative result does not preclude SARS-Cov-2 infection and should not be used as the sole basis for treatment or other patient management decisions. A negative result may occur with  improper specimen collection/handling, submission of specimen other than nasopharyngeal swab, presence of viral mutation(s) within the areas targeted by this assay, and inadequate number of viral copies(<138 copies/mL). A negative result must be combined  with clinical observations, patient history, and epidemiological information. The expected result is Negative.  Fact Sheet for Patients:  EntrepreneurPulse.com.au  Fact Sheet for Healthcare Providers:  IncredibleEmployment.be  This test is no t yet approved or cleared by the [place] FDA and  has been authorized for detection and/or diagnosis of SARS-CoV-2 by FDA under an Emergency Use Authorization (EUA). This EUA  will remain  in effect (meaning this test can be used) for the duration of the COVID-19 declaration under Section 564(b)(1) of the Act, 21 U.S.C.section 360bbb-3(b)(1), unless the authorization is terminated  or revoked sooner.       Influenza A by PCR NEGATIVE NEGATIVE Final   Influenza B by PCR NEGATIVE NEGATIVE Final    Comment: (NOTE) The Xpert Xpress SARS-CoV-2/FLU/RSV plus assay is intended as an aid in the diagnosis of influenza from Nasopharyngeal swab specimens and should not be used as a sole basis for treatment. Nasal washings and aspirates are unacceptable for Xpert Xpress SARS-CoV-2/FLU/RSV testing.  Fact Sheet for Patients: EntrepreneurPulse.com.au  Fact Sheet for Healthcare Providers: IncredibleEmployment.be  This test is not yet approved or cleared by the [place] FDA and has been authorized for detection and/or diagnosis of SARS-CoV-2 by FDA under an Emergency Use Authorization (EUA). This EUA will remain in effect (meaning this test can be used) for the duration of the COVID-19 declaration under Section 564(b)(1) of the Act, 21 U.S.C. section 360bbb-3(b)(1), unless the authorization is terminated or revoked.  Performed at [facility], [address] [place], [place] [address]     Procedures and diagnostic studies:  MR HIP LEFT WO CONTRAST  Result Date: 11/27/2020 CLINICAL DATA:  Left hip pain after fall 3 days ago EXAM: MR OF THE LEFT HIP WITHOUT  CONTRAST TECHNIQUE: Multiplanar, multisequence MR imaging was performed. No intravenous contrast was administered. COMPARISON:  X-ray 11/24/2020 FINDINGS: Bones/Joint/Cartilage Acute nondisplaced intertrochanteric fracture of the proximal left femur with extensive surrounding bone marrow edema (series 4, image 19). No angulation. No fracture involvement of the femoral head. There is periosteal edema surrounding the subtrochanteric aspect of the [facility] ir without evidence of fracture extension distally. No additional fractures. No dislocation. No femoral head avascular necrosis. Mild degenerative changes of the bilateral hip joints. Small left hip joint effusion, likely reactive. Lower lumbar degenerative disc disease. Ligaments Intact. Muscles and Tendons Partial tear of the left hamstring tendon origin (series 3, image 16). Partial-thickness insertional tear of the left gluteus minimus tendon (series 3, image 24). The remaining tendinous structures are within normal limits. There is intramuscular edema surrounding the proximal left hip and thigh, likely reactive/posttraumatic. Soft tissues No soft tissue fluid collection or hematoma. No inguinal lymphadenopathy. Sigmoid diverticulosis. No acute findings within the visualized pelvis. IMPRESSION: 1. Acute nondisplaced intertrochanteric fracture of the proximal left femur with extensive surrounding bone marrow edema. 2. Partial tear of the left hamstring tendon origin and left gluteus minimus tendon insertion. These results will be called to the ordering clinician or representative by the Radiologist Assistant, and communication documented in the PACS or [employer]. Electronically Signed   By: Davina Poke D.O.   On: 11/27/2020 15:49   DG Chest Portable 1 View  Result Date: 11/27/2020 CLINICAL DATA:  Preop hip fracture EXAM: PORTABLE CHEST 1 VIEW COMPARISON:  None. FINDINGS: The heart size and mediastinal contours are within normal limits. Aortic knob  calcifications are seen. Both lungs are clear. The visualized skeletal structures are unremarkable. IMPRESSION: No active disease. Electronically Signed   By: Prudencio Pair M.D.   On: 11/27/2020 19:21               LOS: 1 day   Sabirin Baray  [facility]   Pager on [email or web address]. If 7PM-7AM, please contact night-coverage at [email or web address]     11/28/2020, 12:43 PM

## 2020-11-28 NOTE — Transfer of Care (Signed)
Immediate Anesthesia Transfer of Care Note  Patient: Katelyn Lee  Procedure(s) Performed: INTRAMEDULLARY (IM) NAIL INTERTROCHANTRIC (Left Hip)  Patient Location: PACU  Anesthesia Type:General  Level of Consciousness: awake, alert  and oriented  Airway & Oxygen Therapy: Patient Spontanous Breathing and Patient connected to face mask oxygen  Post-op Assessment: Report given to RN and Post -op Vital signs reviewed and stable  Post vital signs: Reviewed and stable  Last Vitals:  Vitals Value Taken Time  BP 171/89 11/28/20 1648  Temp    Pulse 85 11/28/20 1650  Resp    SpO2 100 % 11/28/20 1650  Vitals shown include unvalidated device data.  Last Pain:  Vitals:   11/28/20 1304  TempSrc: Oral  PainSc: 0-No pain         Complications: No complications documented.

## 2020-11-28 NOTE — H&P (Signed)
H&P reviewed. No significant changes noted.  

## 2020-11-28 NOTE — Plan of Care (Signed)

## 2020-11-28 NOTE — Op Note (Signed)
DATE OF SURGERY: 11/28/2020  PREOPERATIVE DIAGNOSIS: Left intertrochanteric hip fracture  POSTOPERATIVE DIAGNOSIS: Left intertrochanteric hip fracture  PROCEDURE: Intramedullary nailing of L femur with cephalomedullary device  SURGEON: Cato Mulligan, MD  ANESTHESIA: spinal  EBL: 100 cc  IVF: per anesthesia record  COMPONENTS:  [employer] Trigen Intertan Short Nail: 10x178mm; 87mm lag screw with 42mm compression screw; 5x 42mm distal cortical interlocking screw  INDICATIONS: Katelyn Lee is a 84 y.o. female who sustained an intertrochanteric fracture after a fall ~ 10 days ago. She was ambulatory afterwards, but continued to have increased hip pain and eventually could no longer bear weight. Radiographs after the fall did not show fracture, but eventual MRI showed non-displaced intertrochanteric hip fracture. Risks and benefits of intramedullary nailing were explained to the patient and/or family . Risks include but are not limited to bleeding, infection, injury to tissues, nerves, vessels, nonunion/malunion, hardware failure, limb length discrepancy/hip rotation mismatch and risks of anesthesia. The patient and/or family understand these risks, have completed an informed consent, and wish to proceed.   PROCEDURE:  The patient was brought into the operating room. After administering anesthesia, the patient was placed in the supine position on the Hana table. The uninjured leg was placed in an extended position while the injured lower extremity was placed in longitudinal traction. The fracture was reduced using longitudinal traction and internal rotation. The adequacy of reduction was verified fluoroscopically in AP and lateral projections and found to be acceptable. The lateral aspect of the hip and thigh were prepped with ChloraPrep solution before being draped sterilely. Preoperative IV antibiotics were administered. A timeout was performed to verify the appropriate surgical site,  patient, and procedure.    The greater trochanter was identified and an approximately 6 cm incision was made about 3 fingerbreadths above the tip of the greater trochanter. The incision was carried down through the subcutaneous tissues to expose the gluteal fascia. This was split the length of the incision, providing access to the tip of the trochanter. Under fluoroscopic guidance, a guidewire was drilled through the tip of the trochanter into the proximal metaphysis to the level of the lesser trochanter. After verifying its position fluoroscopically in AP and lateral projections, it was overreamed with the opening reamer to the level of the lesser trochanter. A guidewire was passed down through the femoral canal to the supracondylar region. The guidewire was overreamed sequentially using the flexible reamers. The nail was selected and advanced to the appropriate depth as verified fluoroscopically.    The guide system for the lag screw was positioned and advanced through an approximately 5cm incision over the lateral aspect of the proximal femur. The guidewire was drilled up through the femoral nail and into the femoral neck to rest within 5 mm of subchondral bone. After verifying its position in the femoral neck and head in both AP and lateral projections, the guidewire was measured and appropriate sized lag screw was selected.  The channel for the compression screw was drilled and antirotation bar was placed.  Lag screw was drilled and placed in appropriate position.  Compression screw was then placed.  Appropriate compression was achieved.  The set screw was locked in place. Again, the adequacy of hardware position and fracture reduction was verified fluoroscopically in AP and lateral projections.   Attention was then turned to the distal interlocking screw in the diaphysis. Using a targeted assembly, a stab incision was made and hole was drilled through the nail. An interlocking screw was  placed with  excellent purchase.  Appropriate screw position was verified fluoroscopically in AP and lateral projections.   The wounds were irrigated thoroughly with sterile saline solution. Local anesthetic was injected into the wounds. Deep fascia was closed with 0-Vicryl. The subcutaneous tissues were closed using 2-0 Vicryl interrupted sutures. The skin was closed using staples. Sterile occlusive dressings were applied to all wounds. The patient was then transferred to the recovery room in satisfactory condition.   POSTOPERATIVE PLAN: The patient will be WBAT on the operative extremity. Resume home Xarelto on POD#1. Perioperative IV antibiotics x 24 hours. PT/OT on POD#1.

## 2020-11-29 LAB — GLUCOSE, CAPILLARY: Glucose-Capillary: 94 mg/dL (ref 70–99)

## 2020-11-29 LAB — CBC
HCT: 28.8 % — ABNORMAL LOW (ref 36.0–46.0)
Hemoglobin: 9.5 g/dL — ABNORMAL LOW (ref 12.0–15.0)
MCH: 29.6 pg (ref 26.0–34.0)
MCHC: 33 g/dL (ref 30.0–36.0)
MCV: 89.7 fL (ref 80.0–100.0)
Platelets: 207 10*3/uL (ref 150–400)
RBC: 3.21 MIL/uL — ABNORMAL LOW (ref 3.87–5.11)
RDW: 13.6 % (ref 11.5–15.5)
WBC: 6.5 10*3/uL (ref 4.0–10.5)
nRBC: 0 % (ref 0.0–0.2)

## 2020-11-29 LAB — BASIC METABOLIC PANEL
Anion gap: 9 (ref 5–15)
BUN: 23 mg/dL (ref 8–23)
CO2: 23 mmol/L (ref 22–32)
Calcium: 8.7 mg/dL — ABNORMAL LOW (ref 8.9–10.3)
Chloride: 106 mmol/L (ref 98–111)
Creatinine, Ser: 1.07 mg/dL — ABNORMAL HIGH (ref 0.44–1.00)
GFR, Estimated: 52 mL/min — ABNORMAL LOW (ref >60)
Glucose, Bld: 145 mg/dL — ABNORMAL HIGH (ref 70–99)
Potassium: 4.5 mmol/L (ref 3.5–5.1)
Sodium: 138 mmol/L (ref 135–145)

## 2020-11-29 NOTE — Progress Notes (Signed)
PT Cancellation Note  Patient Details Name: Katelyn Lee MRN: [id number] DOB: 21-Feb-1936   Cancelled Treatment:    Reason Eval/Treat Not Completed: Pain limiting ability to participate.  Pt noted that she was in too much pain to participate in therapy this PM.  Nursing notified.  Pt will be seen at later date as medically appropriate.   Gwenlyn Saran, PT, DPT 11/29/20, 3:58 PM

## 2020-11-29 NOTE — Evaluation (Signed)
Physical Therapy Evaluation Patient Details Name: Katelyn Lee MRN: [id number] DOB: 1936-12-07 Today's Date: 11/29/2020   History of Present Illness  Pt. is an 84 y.o. female who was admitted to [facility] for IM nailing repair of a left closed nondisplaced intertrochanteric Femur Fx. Pt. sustained the Femur fx during a fall down the outside steps 10 days prior to being admitted for surgery.  PMH includes: A-fib, dyspnea, dysrhythmia, goiter, insomnia, GERD, and SOB.     Clinical Impression  Pt received in Semi-Fowler's position and agreeable to therapy.  Pt able to perform bed-level exercises without much difficulty.  Pt also able to proceed with modI bed mobility.  Upon coming into standing position, pt was noted to have bleeding around the incision site that was also on the bed.  Nursing notified and came to place bandage on site.  Pt performed good standing balance test while bandage was being placed.  Pt then proceeded to ambulate 24f before noticing fatigue and deciding to turn around.  Pt was placed in chair with all needs met and call bell within reach.  Pt will continue to benefit from skilled therapy during hospital stay and would benefit from skilled therapy with HHPT.  Pt agreeable to current POC.    Follow Up Recommendations Home health PT    Equipment Recommendations  None recommended by PT    Recommendations for Other Services       Precautions / Restrictions Precautions Precautions: None Restrictions Weight Bearing Restrictions: Yes LLE Weight Bearing: Weight bearing as tolerated      Mobility  Bed Mobility Overal bed mobility: Modified Independent             General bed mobility comments: Pt supine in bed upon arrival and maneuver to seated EOB without any difficulty.    Transfers Overall transfer level: Needs assistance Equipment used: Rolling walker (2 wheeled) Transfers: Sit to/from Stand Sit to Stand: Min assist         General transfer comment: Pt  require minA from therapist to come upright from slightly elevated surface/bed  Ambulation/Gait Ambulation/Gait assistance: Min guard Gait Distance (Feet): 28 Feet Assistive device: Rolling walker (2 wheeled) Gait Pattern/deviations: Step-through pattern;Decreased step length - right;Decreased stance time - left;Decreased stride length Gait velocity: decreased   General Gait Details: Pt reported fatigue after ambulating approximately 14 ft, so pt turned back around and ambulated back to bed.  Stairs            Wheelchair Mobility    Modified Rankin (Stroke Patients Only)       Balance Overall balance assessment: Needs assistance Sitting-balance support: Feet supported Sitting balance-Leahy Scale: Good     Standing balance support: Bilateral upper extremity supported Standing balance-Leahy Scale: Fair                               Pertinent Vitals/Pain Pain Assessment: Faces Pain Score: 8  Faces Pain Scale: Hurts whole lot Pain Location: L Hip Pain Descriptors / Indicators: Aching;Sore Pain Intervention(s): Limited activity within patient's tolerance;Monitored during session;Repositioned    Home Living Family/patient expects to be discharged to:: Private residence Living Arrangements: Alone Available Help at Discharge: Family (daughter from out of town will stay with the pt. Pt. has supportive sons who live in the area.) Type of Home: House Home Access: Stairs to enter;Ramped entrance   Entrance Stairs-Number of Steps: 2 Home Layout: One level Home Equipment: Hand held shower head;Shower  seat;Grab bars - tub/shower;Bedside commode      Prior Function Level of Independence: Independent               Hand Dominance   Dominant Hand: Right    Extremity/Trunk Assessment   Upper Extremity Assessment Upper Extremity Assessment: Overall WFL for tasks assessed    Lower Extremity Assessment Lower Extremity Assessment: LLE  deficits/detail LLE Deficits / Details: Pain and weakness in the L LE due to surgical procedure. LLE: Unable to fully assess due to pain LLE Sensation: WNL LLE Coordination: WNL       Communication   Communication: No difficulties  Cognition Arousal/Alertness: Awake/alert Behavior During Therapy: WFL for tasks assessed/performed Overall Cognitive Status: Within Functional Limits for tasks assessed                                        General Comments      Exercises Total Joint Exercises Ankle Circles/Pumps: AROM;Strengthening;Both;10 reps;Supine Quad Sets: AROM;Strengthening;Both;10 reps;Supine Gluteal Sets: AROM;Strengthening;Both;10 reps;Supine Hip ABduction/ADduction: AROM;Strengthening;Both;10 reps;Supine Straight Leg Raises: AROM;Strengthening;Both;10 reps;Supine Long Arc Quad: AROM;Strengthening;Both;10 reps;Seated Marching in Standing: AROM;Strengthening;Both;10 reps;Standing Other Exercises Other Exercises: Pt educated on roles of PT and services provided during hospital stay.  Pt also educated on importance of exercising in between bouts of therapy.   Assessment/Plan    PT Assessment Patient needs continued PT services  PT Problem List Decreased strength;Decreased range of motion;Decreased activity tolerance;Decreased balance;Decreased mobility;Decreased knowledge of use of DME;Decreased safety awareness;Pain       PT Treatment Interventions DME instruction;Gait training;Stair training;Functional mobility training;Therapeutic activities;Therapeutic exercise;Balance training    PT Goals (Current goals can be found in the Care Plan section)  Acute Rehab PT Goals Patient Stated Goal: To go home/ PT Goal Formulation: With patient Time For Goal Achievement: 12/13/20 Potential to Achieve Goals: Good    Frequency BID   Barriers to discharge        Co-evaluation               AM-PAC PT "6 Clicks" Mobility  Outcome Measure Help needed  turning from your back to your side while in a flat bed without using bedrails?: A Little Help needed moving from lying on your back to sitting on the side of a flat bed without using bedrails?: A Little Help needed moving to and from a bed to a chair (including a wheelchair)?: A Little Help needed standing up from a chair using your arms (e.g., wheelchair or bedside chair)?: A Little Help needed to walk in hospital room?: A Little Help needed climbing 3-5 steps with a railing? : A Lot 6 Click Score: 17    End of Session Equipment Utilized During Treatment: Gait belt Activity Tolerance: No increased pain Patient left: in chair;with call bell/phone within reach;with chair alarm set Nurse Communication: Mobility status PT Visit Diagnosis: Unsteadiness on feet (R26.81);Other abnormalities of gait and mobility (R26.89);Repeated falls (R29.6);Muscle weakness (generalized) (M62.81);History of falling (Z91.81);Difficulty in walking, not elsewhere classified (R26.2);Pain Pain - Right/Left: Left Pain - part of body: Hip    Time: 4174-0814 PT Time Calculation (min) (ACUTE ONLY): 52 min   Charges:   PT Evaluation $PT Eval Low Complexity: 1 Low PT Treatments $Gait Training: 23-37 mins $Therapeutic Exercise: 8-22 mins        Gwenlyn Saran, PT, DPT 11/29/20, 3:04 PM

## 2020-11-29 NOTE — TOC Progression Note (Signed)
Transition of Care [facility]) - Progression Note    Patient Details  Name: Katelyn Lee MRN: [id number] Date of Birth: Sep 10, 1936  [facility] of [facility] CM/SW Contact  Izola Price, RN Phone Number: 11/29/2020, 1:52 PM  Clinical Narrative:    12/11 New TOC consult acknowledged at 1345 today s/p 12/10 Left Hip nailing post fall 10 says ago. Pt Lives alone independent except for a "reacher" PTA per OT notes.  Having 8/10 pain during OT session around noon today. Simmie Davies RN CM  PCP: Dr. Thereasa Distance (General) Rx: [id number] [place] [address] [phone] Cell.   PTA: (Per OT notes)  Lived Alone. Independent in ADL's/medication management.  2 sons live locally. Daughter lives in [place] but planning on staying with  patient on discharge.   Patient was able to drive.        Expected Discharge Plan and Services                                                 Social Determinants of Health (SDOH) Interventions    Readmission Risk Interventions No flowsheet data found.

## 2020-11-29 NOTE — Progress Notes (Addendum)
Occupational Therapy Evaluation Patient Details Name: Katelyn Lee MRN: [id number] DOB: 15-Jun-1936 Today's Date: 11/29/2020    History of present illness Pt. is an 84 y.o. female who was admitted to [facility] for IM nailing repair of a left closed nondisplaced intertrochanteric Femur Fx. Pt. sustained the Femur fx during a fall down the outside steps 10 days prior to being admitted for surgery.   OT comments  Pt. presents with 8/10 pain, weakness, limited activity tolerance, and limited functional mobility which limits her ability to complete basic ADL and IADL functioning. Pt. resides at home alone, and was independent with morning ADLs, and most IADLs including medication management. Pt.'s daughter, and church family assist with meal preparation. Pt. Reports that she was able to drive. Per pt.'s report her daughter is planning to stay with her upon returning home. The pt.'s daughter is in [place], however has been staying with her, and is returning to stay with upon discharge. Pt. Reports having 2 supportive sons that live locally. Pt. education was provided about A/E use for LE ADLs. Pt. Reports having a reacher that she uses at home to mostly pick up items as needed. Pt. Is limited by 8/10 pain. Pt. Could benefit from OT services for ADL training, additional A/E training, and pt. Education about home modification, and DME.   Follow Up Recommendations  Home health OT    Equipment Recommendations  None recommended by OT    Recommendations for Other Services      Precautions / Restrictions Precautions Precautions: None Restrictions Weight Bearing Restrictions: Yes LLE Weight Bearing: Weight bearing as tolerated       Mobility Bed Mobility               General bed mobility comments: Pt. seen bedside with HOB elevated. Pt. was able to reposition.  Transfers Overall transfer level: Needs assistance               General transfer comment: Deferred    Balance                                            ADL either performed or assessed with clinical judgement   ADL Overall ADL's : Needs assistance/impaired Eating/Feeding: Set up;Independent;Bed level   Grooming: Set up;Independent;Bed level   Upper Body Bathing: Set up;Minimal assistance   Lower Body Bathing: Set up;Maximal assistance   Upper Body Dressing : Set up;Minimal assistance   Lower Body Dressing: Set up;Maximal assistance                 General ADL Comments: Pt. education was provided about A/E for LE ADLs.     Vision Baseline Vision/History: Wears glasses     Perception     Praxis      Cognition Arousal/Alertness: Awake/alert Behavior During Therapy: WFL for tasks assessed/performed Overall Cognitive Status: Within Functional Limits for tasks assessed                                          Exercises     Shoulder Instructions       General Comments      Pertinent Vitals/ Pain       Pain Assessment: 0-10 Pain Score: 8  Pain Descriptors / Indicators: Aching;Sore Pain Intervention(s): Limited  activity within patient's tolerance  Home Living Family/patient expects to be discharged to:: Private residence Living Arrangements: Alone Available Help at Discharge: Family (daughter from out of town will stay with the pt. Pt. has supportive sons who live in the area.) Type of Home: House Home Access: Stairs to enter [employer] of Steps: 2   Home Layout: One level     Bathroom Shower/Tub: Walk-in shower         Home Equipment: Hand held shower head;Shower seat;Grab bars - tub/shower;Bedside commode          Prior Functioning/Environment Level of Independence: Independent            Frequency  Min 2X/week        Progress Toward Goals  OT Goals(current goals can now be found in the care plan section)     Acute Rehab OT Goals Patient Stated Goal: To return home with family assist OT Goal  Formulation: With patient Potential to Achieve Goals: Good ADL Goals Pt Will Perform Upper Body Dressing: Independently Pt Will Perform Lower Body Dressing: with min assist Pt Will Transfer to Toilet: with min assist  Plan      Co-evaluation                 AM-PAC OT "6 Clicks" Daily Activity     Outcome Measure   Help from another person eating meals?: None Help from another person taking care of personal grooming?: None Help from another person toileting, which includes using toliet, bedpan, or urinal?: A Lot Help from another person bathing (including washing, rinsing, drying)?: A Lot Help from another person to put on and taking off regular upper body clothing?: A Little Help from another person to put on and taking off regular lower body clothing?: A Lot 6 Click Score: 17    End of Session    OT Visit Diagnosis: Muscle weakness (generalized) (M62.81)   Activity Tolerance Patient tolerated treatment well   Patient Left in bed;with call bell/phone within reach;with bed alarm set   Nurse Communication          Time: 1001-1026 OT Time Calculation (min): 25 min  Charges: OT General Charges $OT Visit: 1 Visit OT Evaluation $OT Eval Low Complexity: 1 Low  Harrel Carina, MS, OTR/L   Harrel Carina 11/29/2020, 12:21 PM

## 2020-11-29 NOTE — Progress Notes (Signed)
Notified MD pt's surgical dressing is soaked in bright red blood.  Reinforcing with ABD at this time

## 2020-11-29 NOTE — Progress Notes (Addendum)
Progress Note    Katelyn Lee  URK:[id number] DOB: [birth year]  DOA: 11/27/2020 PCP: Sofie Hartigan, MD      Brief Narrative:    Medical records reviewed and are as summarized below:  Katelyn Lee is a 84 y.o. female       Assessment/Plan:   Principal Problem:   Closed nondisplaced intertrochanteric fracture of left femur, initial encounter ([place]) Active Problems:   History of stroke   Paroxysmal A-fib ([place])   Mixed hyperlipidemia   Body mass index is 27.82 kg/m.   Left intertrochanteric hip fracture: s/p intramedullary nailing of left femur on 11/28/2020.  Continue analgesics.  PT and OT.  Follow-up with orthopedic surgeon.    Paroxysmal atrial fibrillation and history of stroke: Xarelto has been resumed.    Vitamin D deficiency: Vitamin D level is 21.57.  Continue vitamin D supplement Hyperlipidemia: Continue rosuvastatin  Anxiety: She is on sertraline.    Diet Order            Diet regular Room service appropriate? Yes; Fluid consistency: Thin  Diet effective now                    Consultants:  Orthopedic surgeon, Dr. Leim Fabry  Procedures:  Left hip surgery on 11/28/2020    Medications:   . acetaminophen  1,000 mg Oral Q8H  . cholecalciferol  1,000 Units Oral Daily  . docusate sodium  100 mg Oral BID  . ketorolac  7.5 mg Intravenous Q6H  . rivaroxaban  20 mg Oral QPC breakfast  . rosuvastatin  5 mg Oral QPC breakfast  . sertraline  150 mg Oral QPC breakfast   Continuous Infusions: . sodium chloride Stopped (11/28/20 2036)  . methocarbamol (ROBAXIN) IV       Anti-infectives (From admission, onward)   Start     Dose/Rate Route Frequency Ordered Stop   11/28/20 2000  ceFAZolin (ANCEF) IVPB 2g/100 mL premix        2 g 200 mL/hr over 30 Minutes Intravenous Every 6 hours 11/28/20 1807 11/29/20 0924   11/28/20 0600  ceFAZolin (ANCEF) IVPB 2g/100 mL premix        2 g 200 mL/hr over 30 Minutes Intravenous On call to O.R.  11/27/20 2252 11/28/20 1536             Family Communication/Anticipated D/C date and plan/Code Status   DVT prophylaxis: SCDs Start: 11/28/20 1807 rivaroxaban (XARELTO) tablet 20 mg     Code Status: DNR  Family Communication: None Disposition Plan:    Status is: Inpatient  Remains inpatient appropriate because:Unsafe d/c plan and Inpatient level of care appropriate due to severity of illness   Dispo: The patient is from: Home              Anticipated d/c is to: SNF              Anticipated d/c date is: 3 days              Patient currently is not medically stable to d/c.           Subjective:   C/o left hip pain  Objective:    Vitals:   11/28/20 2341 11/29/20 0454 11/29/20 0826 11/29/20 1133  BP: 105/64 114/87 126/65 (!) 100/57  Pulse: 72 67 69 73  Resp: 16 16 17 16   Temp: 98.9 F (37.2 C) 98.5 F (36.9 C) 98 F (36.7 C) 98.5 F (36.9 C)  TempSrc:   Oral Oral  SpO2: 91% 93% 94% 96%  Weight:      Height:       No data found.   Intake/Output Summary (Last 24 hours) at 11/29/2020 1324 Last data filed at 11/29/2020 1028 Gross per 24 hour  Intake 1705.69 ml  Output 100 ml  Net 1605.69 ml   Filed Weights   11/27/20 1745  Weight: 83 kg    Exam:  GEN: NAD SKIN: No rash EYES: EOMI ENT: MMM CV: RRR PULM: CTA B ABD: soft, ND, NT, +BS CNS: AAO x 3, non focal EXT: Left hip tenderness.  Dressing on surgical wound on left hip is stained with blood but there is no active bleeding.  Dressing on surgical wound on lateral aspect of distal thigh is clean and dry.      Data Reviewed:   I have personally reviewed following labs and imaging studies:  Labs: Labs show the following:   Basic Metabolic Panel: Recent Labs  Lab 11/27/20 1813 11/28/20 0513 11/29/20 0427  NA 136 139 138  K 4.8 4.5 4.5  CL 104 105 106  CO2 24 25 23   GLUCOSE 98 94 145*  BUN 21 18 23   CREATININE 1.00 0.87 1.07*  CALCIUM 8.8* 8.9 8.7*   GFR Estimated  Creatinine Clearance: 45.8 mL/min (A) (by C-G formula based on SCr of 1.07 mg/dL (H)). Liver Function Tests: Recent Labs  Lab 11/27/20 1813  AST 28  ALT 19  ALKPHOS 75  BILITOT 0.7  PROT 6.8  ALBUMIN 3.6   No results for input(s): LIPASE, AMYLASE in the last 168 hours. No results for input(s): AMMONIA in the last 168 hours. Coagulation profile Recent Labs  Lab 11/27/20 1813  INR 2.7*    CBC: Recent Labs  Lab 11/27/20 1813 11/28/20 0513 11/29/20 0427  WBC 7.2 4.7 6.5  NEUTROABS 4.8  --   --   HGB 11.6* 11.0* 9.5*  HCT 36.6 33.7* 28.8*  MCV 93.1 90.8 89.7  PLT 222 229 207   Cardiac Enzymes: No results for input(s): CKTOTAL, CKMB, CKMBINDEX, TROPONINI in the last 168 hours. BNP (last 3 results) No results for input(s): PROBNP in the last 8760 hours. CBG: Recent Labs  Lab 11/29/20 1211  GLUCAP 94   D-Dimer: No results for input(s): DDIMER in the last 72 hours. Hgb A1c: No results for input(s): HGBA1C in the last 72 hours. Lipid Profile: No results for input(s): CHOL, HDL, LDLCALC, TRIG, CHOLHDL, LDLDIRECT in the last 72 hours. Thyroid function studies: No results for input(s): TSH, T4TOTAL, T3FREE, THYROIDAB in the last 72 hours.  Invalid input(s): FREET3 Anemia work up: No results for input(s): VITAMINB12, FOLATE, FERRITIN, TIBC, IRON, RETICCTPCT in the last 72 hours. Sepsis Labs: Recent Labs  Lab 11/27/20 1813 11/28/20 0513 11/29/20 0427  WBC 7.2 4.7 6.5    Microbiology Recent Results (from the past 240 hour(s))  Resp Panel by RT-PCR (Flu A&B, Covid) Nasopharyngeal Swab     Status: None   Collection Time: 11/27/20  6:13 PM   Specimen: Nasopharyngeal Swab; Nasopharyngeal(NP) swabs in vial transport medium  Result Value Ref Range Status   SARS Coronavirus 2 by RT PCR NEGATIVE NEGATIVE Final    Comment: (NOTE) SARS-CoV-2 target nucleic acids are NOT DETECTED.  The SARS-CoV-2 RNA is generally detectable in upper respiratory specimens during the  acute phase of infection. The lowest concentration of SARS-CoV-2 viral copies this assay can detect is 138 copies/mL. A negative result does not preclude SARS-Cov-2 infection  and should not be used as the sole basis for treatment or other patient management decisions. A negative result may occur with  improper specimen collection/handling, submission of specimen other than nasopharyngeal swab, presence of viral mutation(s) within the areas targeted by this assay, and inadequate number of viral copies(<138 copies/mL). A negative result must be combined with clinical observations, patient history, and epidemiological information. The expected result is Negative.  Fact Sheet for Patients:  EntrepreneurPulse.com.au  Fact Sheet for Healthcare Providers:  IncredibleEmployment.be  This test is no t yet approved or cleared by the [place] FDA and  has been authorized for detection and/or diagnosis of SARS-CoV-2 by FDA under an Emergency Use Authorization (EUA). This EUA will remain  in effect (meaning this test can be used) for the duration of the COVID-19 declaration under Section 564(b)(1) of the Act, 21 U.S.C.section 360bbb-3(b)(1), unless the authorization is terminated  or revoked sooner.       Influenza A by PCR NEGATIVE NEGATIVE Final   Influenza B by PCR NEGATIVE NEGATIVE Final    Comment: (NOTE) The Xpert Xpress SARS-CoV-2/FLU/RSV plus assay is intended as an aid in the diagnosis of influenza from Nasopharyngeal swab specimens and should not be used as a sole basis for treatment. Nasal washings and aspirates are unacceptable for Xpert Xpress SARS-CoV-2/FLU/RSV testing.  Fact Sheet for Patients: EntrepreneurPulse.com.au  Fact Sheet for Healthcare Providers: IncredibleEmployment.be  This test is not yet approved or cleared by the [place] FDA and has been authorized for detection and/or  diagnosis of SARS-CoV-2 by FDA under an Emergency Use Authorization (EUA). This EUA will remain in effect (meaning this test can be used) for the duration of the COVID-19 declaration under Section 564(b)(1) of the Act, 21 U.S.C. section 360bbb-3(b)(1), unless the authorization is terminated or revoked.  Performed at [facility], [address] [place], [place] [address]     Procedures and diagnostic studies:  MR HIP LEFT WO CONTRAST  Result Date: 11/27/2020 CLINICAL DATA:  Left hip pain after fall 3 days ago EXAM: MR OF THE LEFT HIP WITHOUT CONTRAST TECHNIQUE: Multiplanar, multisequence MR imaging was performed. No intravenous contrast was administered. COMPARISON:  X-ray 11/24/2020 FINDINGS: Bones/Joint/Cartilage Acute nondisplaced intertrochanteric fracture of the proximal left femur with extensive surrounding bone marrow edema (series 4, image 19). No angulation. No fracture involvement of the femoral head. There is periosteal edema surrounding the subtrochanteric aspect of the [facility] ir without evidence of fracture extension distally. No additional fractures. No dislocation. No femoral head avascular necrosis. Mild degenerative changes of the bilateral hip joints. Small left hip joint effusion, likely reactive. Lower lumbar degenerative disc disease. Ligaments Intact. Muscles and Tendons Partial tear of the left hamstring tendon origin (series 3, image 16). Partial-thickness insertional tear of the left gluteus minimus tendon (series 3, image 24). The remaining tendinous structures are within normal limits. There is intramuscular edema surrounding the proximal left hip and thigh, likely reactive/posttraumatic. Soft tissues No soft tissue fluid collection or hematoma. No inguinal lymphadenopathy. Sigmoid diverticulosis. No acute findings within the visualized pelvis. IMPRESSION: 1. Acute nondisplaced intertrochanteric fracture of the proximal left femur with extensive surrounding bone  marrow edema. 2. Partial tear of the left hamstring tendon origin and left gluteus minimus tendon insertion. These results will be called to the ordering clinician or representative by the Radiologist Assistant, and communication documented in the PACS or [employer]. Electronically Signed   By: Davina Poke D.O.   On: 11/27/2020 15:49   DG Chest Portable  1 View  Result Date: 11/27/2020 CLINICAL DATA:  Preop hip fracture EXAM: PORTABLE CHEST 1 VIEW COMPARISON:  None. FINDINGS: The heart size and mediastinal contours are within normal limits. Aortic knob calcifications are seen. Both lungs are clear. The visualized skeletal structures are unremarkable. IMPRESSION: No active disease. Electronically Signed   By: Prudencio Pair M.D.   On: 11/27/2020 19:21   DG HIP OPERATIVE UNILAT W OR W/O PELVIS LEFT  Result Date: 11/28/2020 CLINICAL DATA:  Imaging for ORIF of an intertrochanteric left proximal femur fracture. EXAM: OPERATIVE LEFT HIP (WITH PELVIS IF PERFORMED) 4 VIEWS TECHNIQUE: Fluoroscopic spot image(s) were submitted for interpretation post-operatively. COMPARISON:  MRI, 11/27/2020 FINDINGS: Short intramedullary rod extends from the greater trochanter to the proximal diaphysis. Rod supports 2 compression screws extending to the femoral head. The intertrochanteric fracture is not defined. There is no evidence of displacement or angulation. Orthopedic hardware is well-seated. No evidence of a fracture complication. IMPRESSION: 1. Well aligned proximal left femur fracture following ORIF. Electronically Signed   By: Lajean Manes M.D.   On: 11/28/2020 16:49               LOS: 2 days   Darwin Rothlisberger  [facility]   Pager on [email or web address]. If 7PM-7AM, please contact night-coverage at [email or web address]     11/29/2020, 1:24 PM

## 2020-11-29 NOTE — Progress Notes (Signed)
   Subjective: 1 Day Post-Op Procedure(s) (LRB): INTRAMEDULLARY (IM) NAIL INTERTROCHANTRIC (Left) Patient reports pain as moderate.   Patient is well, and has had no acute complaints or problems Denies any CP, SOB, ABD pain. We will continue therapy today.   Objective: Vital signs in last 24 hours: Temp:  [97.5 F (36.4 C)-98.9 F (37.2 C)] 98 F (36.7 C) (12/11 0826) Pulse Rate:  [62-80] 69 (12/11 0826) Resp:  [10-19] 17 (12/11 0826) BP: (94-171)/(62-98) 126/65 (12/11 0826) SpO2:  [91 %-99 %] 94 % (12/11 0826)  Intake/Output from previous day: 12/10 0701 - 12/11 0700 In: 1345.7 [I.V.:1045.6; IV Piggyback:300.1] Out: 550 [Urine:450; Blood:100] Intake/Output this shift: No intake/output data recorded.  Recent Labs    11/27/20 1813 11/28/20 0513 11/29/20 0427  HGB 11.6* 11.0* 9.5*   Recent Labs    11/28/20 0513 11/29/20 0427  WBC 4.7 6.5  RBC 3.71* 3.21*  HCT 33.7* 28.8*  PLT 229 207   Recent Labs    11/28/20 0513 11/29/20 0427  NA 139 138  K 4.5 4.5  CL 105 106  CO2 25 23  BUN 18 23  CREATININE 0.87 1.07*  GLUCOSE 94 145*  CALCIUM 8.9 8.7*   Recent Labs    11/27/20 1813  INR 2.7*    EXAM General - Patient is Alert, Appropriate and Oriented Extremity - Neurovascular intact Sensation intact distally Intact pulses distally Dorsiflexion/Plantar flexion intact No cellulitis present Compartment soft Dressing -distal incision site intact, dressing clean dry and intact.  Proximal incision site with saturated honeycomb dressing.  This is removed and honeycomb dressing applied with slight tension to help with bleeding.  We will continue to monitor dressing Motor Function - intact, moving foot and toes well on exam.   Past Medical History:  Diagnosis Date  . A-fib ([place])   . Arthritis    legs, lower back  . Dyspnea   . Dysrhythmia    A-FIB  . Goiter   . Insomnia   . Stroke ([place]) 07/29/2017  . Vertigo   . Wears dentures    full upper and lower   . Wears hearing aid in both ears     Assessment/Plan:   1 Day Post-Op Procedure(s) (LRB): INTRAMEDULLARY (IM) NAIL INTERTROCHANTRIC (Left) Principal Problem:   Closed nondisplaced intertrochanteric fracture of left femur, initial encounter ([facility]) Active Problems:   History of stroke   Paroxysmal A-fib ([facility])   Mixed hyperlipidemia  Estimated body mass index is 27.82 kg/m as calculated from the following:   Height as of this encounter: 5\' 8"  (1.727 m).   Weight as of this encounter: 83 kg. Advance diet Up with therapy  Vital signs are stable Pain well controlled Labs stable.  Hemoglobin 9.5. Care management to assist with discharge to skilled nursing facility. Patient with some drainage to the left hip proximal incision site, new dressing applied today.  We will continue to monitor.  If continuing to saturate dressing may try ABD pad with foam tape for compression.   DVT Prophylaxis - Xarelto, TED hose and SCDs Weight-Bearing as tolerated to left leg   T. Rachelle Hora, PA-C [place] 11/29/2020, 8:53 AM

## 2020-11-30 LAB — BASIC METABOLIC PANEL
Anion gap: 6 (ref 5–15)
BUN: 20 mg/dL (ref 8–23)
CO2: 25 mmol/L (ref 22–32)
Calcium: 8.4 mg/dL — ABNORMAL LOW (ref 8.9–10.3)
Chloride: 105 mmol/L (ref 98–111)
Creatinine, Ser: 0.98 mg/dL (ref 0.44–1.00)
GFR, Estimated: 58 mL/min — ABNORMAL LOW (ref >60)
Glucose, Bld: 99 mg/dL (ref 70–99)
Potassium: 4.1 mmol/L (ref 3.5–5.1)
Sodium: 136 mmol/L (ref 135–145)

## 2020-11-30 LAB — CBC
HCT: 27.3 % — ABNORMAL LOW (ref 36.0–46.0)
Hemoglobin: 8.8 g/dL — ABNORMAL LOW (ref 12.0–15.0)
MCH: 29.1 pg (ref 26.0–34.0)
MCHC: 32.2 g/dL (ref 30.0–36.0)
MCV: 90.4 fL (ref 80.0–100.0)
Platelets: 202 10*3/uL (ref 150–400)
RBC: 3.02 MIL/uL — ABNORMAL LOW (ref 3.87–5.11)
RDW: 13.7 % (ref 11.5–15.5)
WBC: 7.4 10*3/uL (ref 4.0–10.5)
nRBC: 0 % (ref 0.0–0.2)

## 2020-11-30 MED ORDER — FE FUMARATE-B12-VIT C-FA-IFC PO CAPS
1.0000 | ORAL_CAPSULE | Freq: Two times a day (BID) | ORAL | Status: DC
  Administered 2020-11-30 – 2020-12-03 (×7): 1 via ORAL
  Filled 2020-11-30 (×8): qty 1

## 2020-11-30 NOTE — Progress Notes (Signed)
° °  Subjective: 2 Days Post-Op Procedure(s) (LRB): INTRAMEDULLARY (IM) NAIL INTERTROCHANTRIC (Left) Patient reports pain as moderate.  She feels as if pain is inhibiting her ability to walk or physical therapy.  She has been taking very little narcotic pain medications. Patient is well, and has had no acute complaints or problems Denies any CP, SOB, ABD pain. We will continue therapy today.   Objective: Vital signs in last 24 hours: Temp:  [97.8 F (36.6 C)-98.5 F (36.9 C)] 98 F (36.7 C) (12/12 0812) Pulse Rate:  [73-80] 80 (12/12 0812) Resp:  [16-18] 18 (12/12 0812) BP: (100-134)/(57-96) 134/68 (12/12 0812) SpO2:  [95 %-100 %] 100 % (12/12 0812)  Intake/Output from previous day: 12/11 0701 - 12/12 0700 In: 360 [P.O.:360] Out: 400 [Urine:400] Intake/Output this shift: No intake/output data recorded.  Recent Labs    11/27/20 1813 11/28/20 0513 11/29/20 0427 11/30/20 0519  HGB 11.6* 11.0* 9.5* 8.8*   Recent Labs    11/29/20 0427 11/30/20 0519  WBC 6.5 7.4  RBC 3.21* 3.02*  HCT 28.8* 27.3*  PLT 207 202   Recent Labs    11/29/20 0427 11/30/20 0519  NA 138 136  K 4.5 4.1  CL 106 105  CO2 23 25  BUN 23 20  CREATININE 1.07* 0.98  GLUCOSE 145* 99  CALCIUM 8.7* 8.4*   Recent Labs    11/27/20 1813  INR 2.7*    EXAM General - Patient is Alert, Appropriate and Oriented Extremity - Neurovascular intact Sensation intact distally Intact pulses distally Dorsiflexion/Plantar flexion intact No cellulitis present Compartment soft Dressing -distal incision site intact, dressing clean dry and intact.  Proximal incision site covered with foam tape.  Dressing is clean dry and intact.  No significant swelling throughout the thigh. Motor Function - intact, moving foot and toes well on exam.   Past Medical History:  Diagnosis Date   A-fib ([place])    Arthritis    legs, lower back   Dyspnea    Dysrhythmia    A-FIB   Goiter    Insomnia    Stroke ([place])  07/29/2017   Vertigo    Wears dentures    full upper and lower   Wears hearing aid in both ears     Assessment/Plan:   2 Days Post-Op Procedure(s) (LRB): INTRAMEDULLARY (IM) NAIL INTERTROCHANTRIC (Left) Principal Problem:   Closed nondisplaced intertrochanteric fracture of left femur, initial encounter ([place]) Active Problems:   History of stroke   Paroxysmal A-fib ([facility])   Mixed hyperlipidemia  Estimated body mass index is 27.82 kg/m as calculated from the following:   Height as of this encounter: 5\' 8"  (1.727 m).   Weight as of this encounter: 83 kg. Advance diet Up with therapy  Vital signs are stable Pain well controlled Hemoglobin 8.8.  Start iron supplement.  Recheck hemoglobin in the morning Care management to assist with discharge to skilled nursing facility.   DVT Prophylaxis - Xarelto, TED hose and SCDs Weight-Bearing as tolerated to left leg   T. Rachelle Hora, PA-C [place] 11/30/2020, 9:54 AM

## 2020-11-30 NOTE — Progress Notes (Signed)
PT Cancellation Note  Patient Details Name: Katelyn Lee MRN: [id number] DOB: January 25, 1936   Cancelled Treatment:    Reason Eval/Treat Not Completed: Medical issues which prohibited therapy. Per conversation with RN, Vanita Ingles, pt continues to have bleeding from incision site and was re-started on blood thinners. RN asks that PT hold today, and attempt therapy tomorrow. Will follow up with therapy intervention tomorrow as appropriate.  Herminio Commons, PT, DPT 3:08 PM,11/30/20

## 2020-11-30 NOTE — Progress Notes (Signed)
Progress Note    [name]  XFG:[id number] DOB: [birth year]  DOA: 11/27/2020 PCP: Sofie Hartigan, MD      Brief Narrative:    Medical records reviewed and are as summarized below:  Katelyn Lee is a 84 y.o. female       Assessment/Plan:   Principal Problem:   Closed nondisplaced intertrochanteric fracture of left femur, initial encounter ([place]) Active Problems:   History of stroke   Paroxysmal A-fib ([place])   Mixed hyperlipidemia   Body mass index is 27.82 kg/m.   Left intertrochanteric hip fracture: s/p intramedullary nailing of left femur on 11/28/2020.  Continue analgesics, continue PT and OT.  Follow-up with orthopedic surgeon.    Paroxysmal atrial fibrillation and history of stroke: Continue Xarelto  Vitamin D deficiency: Vitamin D level is 21.57.  Continue vitamin D supplement  Hyperlipidemia: Continue rosuvastatin  Anxiety: She is on sertraline.    Diet Order            Diet regular Room service appropriate? Yes; Fluid consistency: Thin  Diet effective now                    Consultants:  Orthopedic surgeon, Dr. Leim Fabry  Procedures:  Left hip surgery on 11/28/2020    Medications:   . cholecalciferol  1,000 Units Oral Daily  . docusate sodium  100 mg Oral BID  . ferrous [id number] C-folic acid  1 capsule Oral BID  . rivaroxaban  20 mg Oral QPC breakfast  . rosuvastatin  5 mg Oral QPC breakfast  . sertraline  150 mg Oral QPC breakfast   Continuous Infusions: . methocarbamol (ROBAXIN) IV       Anti-infectives (From admission, onward)   Start     Dose/Rate Route Frequency Ordered Stop   11/28/20 2000  ceFAZolin (ANCEF) IVPB 2g/100 mL premix        2 g 200 mL/hr over 30 Minutes Intravenous Every 6 hours 11/28/20 1807 11/29/20 0924   11/28/20 0600  ceFAZolin (ANCEF) IVPB 2g/100 mL premix        2 g 200 mL/hr over 30 Minutes Intravenous On call to O.R. 11/27/20 2252 11/28/20 1536              Family Communication/Anticipated D/C date and plan/Code Status   DVT prophylaxis: SCDs Start: 11/28/20 1807 rivaroxaban (XARELTO) tablet 20 mg     Code Status: DNR  Family Communication: None Disposition Plan:    Status is: Inpatient  Remains inpatient appropriate because:Unsafe d/c plan and Inpatient level of care appropriate due to severity of illness   Dispo: The patient is from: Home              Anticipated d/c is to: SNF              Anticipated d/c date is: 3 days              Patient currently is not medically stable to d/c.           Subjective:   She complains of left hip pain that is worse with movement especially during physical therapy.  Objective:    Vitals:   11/29/20 2017 11/29/20 2337 11/30/20 0538 11/30/20 0812  BP: 114/67 121/69 (!) 111/96 134/68  Pulse: 79 76 74 80  Resp: 18 17 17 18   Temp: 98.5 F (36.9 C) 97.8 F (36.6 C) 98.4 F (36.9 C) 98 F (36.7 C)  TempSrc: Oral Oral  Oral Oral  SpO2: 97% 95% 95% 100%  Weight:      Height:       No data found.   Intake/Output Summary (Last 24 hours) at 11/30/2020 1137 Last data filed at 11/30/2020 1023 Gross per 24 hour  Intake 240 ml  Output 400 ml  Net -160 ml   Filed Weights   11/27/20 1745  Weight: 83 kg    Exam:  GEN: No acute distress SKIN: Warm and dry EYES: No pallor or icterus ENT: Moist mucous membranes CV: Regular rate and rhythm PULM: No wheezing or rales heard ABD: Soft, nontender CNS: Alert and oriented x3.  No focal deficits. EXT: Left hip tenderness.  Dressing on left hip surgical wound has blood staining from old blood.  No active bleeding.     Data Reviewed:   I have personally reviewed following labs and imaging studies:  Labs: Labs show the following:   Basic Metabolic Panel: Recent Labs  Lab 11/27/20 1813 11/28/20 0513 11/29/20 0427 11/30/20 0519  NA 136 139 138 136  K 4.8 4.5 4.5 4.1  CL 104 105 106 105  CO2 24 25 23  25   GLUCOSE 98 94 145* 99  BUN 21 18 23 20   CREATININE 1.00 0.87 1.07* 0.98  CALCIUM 8.8* 8.9 8.7* 8.4*   GFR Estimated Creatinine Clearance: 50 mL/min (by C-G formula based on SCr of 0.98 mg/dL). Liver Function Tests: Recent Labs  Lab 11/27/20 1813  AST 28  ALT 19  ALKPHOS 75  BILITOT 0.7  PROT 6.8  ALBUMIN 3.6   No results for input(s): LIPASE, AMYLASE in the last 168 hours. No results for input(s): AMMONIA in the last 168 hours. Coagulation profile Recent Labs  Lab 11/27/20 1813  INR 2.7*    CBC: Recent Labs  Lab 11/27/20 1813 11/28/20 0513 11/29/20 0427 11/30/20 0519  WBC 7.2 4.7 6.5 7.4  NEUTROABS 4.8  --   --   --   HGB 11.6* 11.0* 9.5* 8.8*  HCT 36.6 33.7* 28.8* 27.3*  MCV 93.1 90.8 89.7 90.4  PLT 222 229 207 202   Cardiac Enzymes: No results for input(s): CKTOTAL, CKMB, CKMBINDEX, TROPONINI in the last 168 hours. BNP (last 3 results) No results for input(s): PROBNP in the last 8760 hours. CBG: Recent Labs  Lab 11/29/20 1211  GLUCAP 94   D-Dimer: No results for input(s): DDIMER in the last 72 hours. Hgb A1c: No results for input(s): HGBA1C in the last 72 hours. Lipid Profile: No results for input(s): CHOL, HDL, LDLCALC, TRIG, CHOLHDL, LDLDIRECT in the last 72 hours. Thyroid function studies: No results for input(s): TSH, T4TOTAL, T3FREE, THYROIDAB in the last 72 hours.  Invalid input(s): FREET3 Anemia work up: No results for input(s): VITAMINB12, FOLATE, FERRITIN, TIBC, IRON, RETICCTPCT in the last 72 hours. Sepsis Labs: Recent Labs  Lab 11/27/20 1813 11/28/20 0513 11/29/20 0427 11/30/20 0519  WBC 7.2 4.7 6.5 7.4    Microbiology Recent Results (from the past 240 hour(s))  Resp Panel by RT-PCR (Flu A&B, Covid) Nasopharyngeal Swab     Status: None   Collection Time: 11/27/20  6:13 PM   Specimen: Nasopharyngeal Swab; Nasopharyngeal(NP) swabs in vial transport medium  Result Value Ref Range Status   SARS Coronavirus 2 by RT PCR  NEGATIVE NEGATIVE Final    Comment: (NOTE) SARS-CoV-2 target nucleic acids are NOT DETECTED.  The SARS-CoV-2 RNA is generally detectable in upper respiratory specimens during the acute phase of infection. The lowest concentration of SARS-CoV-2 viral  copies this assay can detect is 138 copies/mL. A negative result does not preclude SARS-Cov-2 infection and should not be used as the sole basis for treatment or other patient management decisions. A negative result may occur with  improper specimen collection/handling, submission of specimen other than nasopharyngeal swab, presence of viral mutation(s) within the areas targeted by this assay, and inadequate number of viral copies(<138 copies/mL). A negative result must be combined with clinical observations, patient history, and epidemiological information. The expected result is Negative.  Fact Sheet for Patients:  EntrepreneurPulse.com.au  Fact Sheet for Healthcare Providers:  IncredibleEmployment.be  This test is no t yet approved or cleared by the [place] FDA and  has been authorized for detection and/or diagnosis of SARS-CoV-2 by FDA under an Emergency Use Authorization (EUA). This EUA will remain  in effect (meaning this test can be used) for the duration of the COVID-19 declaration under Section 564(b)(1) of the Act, 21 U.S.C.section 360bbb-3(b)(1), unless the authorization is terminated  or revoked sooner.       Influenza A by PCR NEGATIVE NEGATIVE Final   Influenza B by PCR NEGATIVE NEGATIVE Final    Comment: (NOTE) The Xpert Xpress SARS-CoV-2/FLU/RSV plus assay is intended as an aid in the diagnosis of influenza from Nasopharyngeal swab specimens and should not be used as a sole basis for treatment. Nasal washings and aspirates are unacceptable for Xpert Xpress SARS-CoV-2/FLU/RSV testing.  Fact Sheet for Patients: EntrepreneurPulse.com.au  Fact Sheet for  Healthcare Providers: IncredibleEmployment.be  This test is not yet approved or cleared by the [place] FDA and has been authorized for detection and/or diagnosis of SARS-CoV-2 by FDA under an Emergency Use Authorization (EUA). This EUA will remain in effect (meaning this test can be used) for the duration of the COVID-19 declaration under Section 564(b)(1) of the Act, 21 U.S.C. section 360bbb-3(b)(1), unless the authorization is terminated or revoked.  Performed at [facility], [address] [place], [place] [address]     Procedures and diagnostic studies:  DG HIP OPERATIVE UNILAT W OR W/O PELVIS LEFT  Result Date: 11/28/2020 CLINICAL DATA:  Imaging for ORIF of an intertrochanteric left proximal femur fracture. EXAM: OPERATIVE LEFT HIP (WITH PELVIS IF PERFORMED) 4 VIEWS TECHNIQUE: Fluoroscopic spot image(s) were submitted for interpretation post-operatively. COMPARISON:  MRI, 11/27/2020 FINDINGS: Short intramedullary rod extends from the greater trochanter to the proximal diaphysis. Rod supports 2 compression screws extending to the femoral head. The intertrochanteric fracture is not defined. There is no evidence of displacement or angulation. Orthopedic hardware is well-seated. No evidence of a fracture complication. IMPRESSION: 1. Well aligned proximal left femur fracture following ORIF. Electronically Signed   By: Lajean Manes M.D.   On: 11/28/2020 16:49               LOS: 3 days   Kearston Putman  [facility]   Pager on [email or web address]. If 7PM-7AM, please contact night-coverage at [email or web address]     11/30/2020, 11:37 AM

## 2020-11-30 NOTE — Progress Notes (Addendum)
PT Cancellation Note  Patient Details Name: Katelyn Lee MRN: [id number] DOB: 07/05/1936   Cancelled Treatment:    Reason Eval/Treat Not Completed: Other (Attempted to see pt this PM. Upon entry, pt in bed with family member at bedside. Family member notes pt does not have dressing over incision site, and that there may be concern for infection due to excessive bleeding. Asks PT to come back when incision site is covered. No mention of this in pt's chart. Attempted reaching nurse x2 - first attempt RN left phone in pt's room, second attempt RN at lunch. Will continue to follow up with therapy intervention as appropriate and with time permitting.)  Herminio Commons, PT, DPT 1:39 PM,11/30/20

## 2020-12-01 ENCOUNTER — Encounter: Payer: Self-pay | Admitting: Orthopedic Surgery

## 2020-12-01 DIAGNOSIS — I480 Paroxysmal atrial fibrillation: Secondary | ICD-10-CM

## 2020-12-01 LAB — CBC
HCT: 25.2 % — ABNORMAL LOW (ref 36.0–46.0)
Hemoglobin: 8.3 g/dL — ABNORMAL LOW (ref 12.0–15.0)
MCH: 29.6 pg (ref 26.0–34.0)
MCHC: 32.9 g/dL (ref 30.0–36.0)
MCV: 90 fL (ref 80.0–100.0)
Platelets: 199 10*3/uL (ref 150–400)
RBC: 2.8 MIL/uL — ABNORMAL LOW (ref 3.87–5.11)
RDW: 13.9 % (ref 11.5–15.5)
WBC: 6.8 10*3/uL (ref 4.0–10.5)
nRBC: 0 % (ref 0.0–0.2)

## 2020-12-01 LAB — BASIC METABOLIC PANEL
Anion gap: 8 (ref 5–15)
BUN: 21 mg/dL (ref 8–23)
CO2: 24 mmol/L (ref 22–32)
Calcium: 8.4 mg/dL — ABNORMAL LOW (ref 8.9–10.3)
Chloride: 104 mmol/L (ref 98–111)
Creatinine, Ser: 0.88 mg/dL (ref 0.44–1.00)
GFR, Estimated: >60 mL/min (ref >60)
Glucose, Bld: 105 mg/dL — ABNORMAL HIGH (ref 70–99)
Potassium: 4 mmol/L (ref 3.5–5.1)
Sodium: 136 mmol/L (ref 135–145)

## 2020-12-01 MED ORDER — ACETAMINOPHEN 500 MG PO TABS
1000.0000 mg | ORAL_TABLET | Freq: Three times a day (TID) | ORAL | Status: DC
  Administered 2020-12-01 – 2020-12-03 (×8): 1000 mg via ORAL
  Filled 2020-12-01 (×7): qty 2

## 2020-12-01 MED ORDER — TRAMADOL HCL 50 MG PO TABS: 50.0000 mg | ORAL_TABLET | Freq: Four times a day (QID) | ORAL | 0 refills | Status: AC | PRN

## 2020-12-01 MED ORDER — OXYCODONE HCL 5 MG PO TABS: 2.5000 mg | ORAL_TABLET | Freq: Four times a day (QID) | ORAL | 0 refills | Status: AC | PRN

## 2020-12-01 NOTE — Discharge Instructions (Signed)
INSTRUCTIONS AFTER Surgery  o Remove items at home which could result in a fall. This includes throw rugs or furniture in walking pathways o ICE to the affected joint every three hours while awake for 30 minutes at a time, for at least the first 3-5 days, and then as needed for pain and swelling.  Continue to use ice for pain and swelling. You may notice swelling that will progress down to the foot and ankle.  This is normal after surgery.  Elevate your leg when you are not up walking on it.   o Continue to use the breathing machine you got in the hospital (incentive spirometer) which will help keep your temperature down.  It is common for your temperature to cycle up and down following surgery, especially at night when you are not up moving around and exerting yourself.  The breathing machine keeps your lungs expanded and your temperature down.   DIET:  As you were doing prior to hospitalization, we recommend a well-balanced diet.  DRESSING / WOUND CARE / SHOWERING  Dressing change as needed.  No showering.  Staples will be removed in 2 weeks at [facility]  o Increase activity slowly as tolerated, but follow the weight bearing instructions below.   o No driving for 6 weeks or until further direction given by your physician.  You cannot drive while taking narcotics.  o No lifting or carrying greater than 10 lbs. until further directed by your surgeon. o Avoid periods of inactivity such as sitting longer than an hour when not asleep. This helps prevent blood clots.  o You may return to work once you are authorized by your doctor.     WEIGHT BEARING  Weightbearing as tolerated on the left   EXERCISES Gait training and ambulation training with a walker.  Strength training.  CONSTIPATION  Constipation is defined medically as fewer than three stools per week and severe constipation as less than one stool per week.  Even if you have a regular bowel pattern at home, your  normal regimen is likely to be disrupted due to multiple reasons following surgery.  Combination of anesthesia, postoperative narcotics, change in appetite and fluid intake all can affect your bowels.   YOU MUST use at least one of the following options; they are listed in order of increasing strength to get the job done.  They are all available over the counter, and you may need to use some, POSSIBLY even all of these options:    Drink plenty of fluids (prune juice may be helpful) and high fiber foods Colace 100 mg by mouth twice a day  Senokot for constipation as directed and as needed Dulcolax (bisacodyl), take with full glass of water  Miralax (polyethylene glycol) once or twice a day as needed.  If you have tried all these things and are unable to have a bowel movement in the first 3-4 days after surgery call either your surgeon or your primary doctor.    If you experience loose stools or diarrhea, hold the medications until you stool forms back up.  If your symptoms do not get better within 1 week or if they get worse, check with your doctor.  If you experience "the worst abdominal pain ever" or develop nausea or vomiting, please contact the office immediately for further recommendations for treatment.   ITCHING:  If you experience itching with your medications, try taking only a single pain pill, or even half a pain pill at  a time.  You can also use Benadryl over the counter for itching or also to help with sleep.   TED HOSE STOCKINGS:  Use stockings on both legs until for at least 2 weeks or as directed by physician office. They may be removed at night for sleeping.  MEDICATIONS:  See your medication summary on the "After Visit Summary" that nursing will review with you.  You may have some home medications which will be placed on hold until you complete the course of blood thinner medication.  It is important for you to complete the blood thinner medication as prescribed.  PRECAUTIONS:   If you experience chest pain or shortness of breath - call 911 immediately for transfer to the hospital emergency department.   If you develop a fever greater that 101 F, purulent drainage from wound, increased redness or drainage from wound, foul odor from the wound/dressing, or calf pain - CONTACT YOUR SURGEON.                                                   FOLLOW-UP APPOINTMENTS:  If you do not already have a post-op appointment, please call the office for an appointment to be seen by your surgeon.  Guidelines for how soon to be seen are listed in your "After Visit Summary", but are typically between 1-4 weeks after surgery.  OTHER INSTRUCTIONS:     MAKE SURE YOU:  . Understand these instructions.  . Get help right away if you are not doing well or get worse.    Thank you for letting us be a part of your medical care team.  It is a privilege we respect greatly.  We hope these instructions will help you stay on track for a fast and full recovery!

## 2020-12-01 NOTE — Care Management Important Message (Signed)
Important Message  Patient Details  Name: Katelyn Lee MRN: [id number] Date of Birth: Mar 17, 1936   Medicare Important Message Given:  Yes     Juliann Pulse A [name] 12/01/2020, 11:05 AM

## 2020-12-01 NOTE — Progress Notes (Addendum)
Physical Therapy Treatment Patient Details Name: Katelyn Lee MRN: [id number] DOB: October 11, 1936 Today's Date: 12/01/2020    History of Present Illness Pt. is an 84 y.o. female who was admitted to [facility] for IM nailing repair of a left closed nondisplaced intertrochanteric Femur Fx. Pt. sustained the Femur fx during a fall down the outside steps 10 days prior to being admitted for surgery.  PMH includes: A-fib, dyspnea, dysrhythmia, goiter, insomnia, GERD, and SOB.    PT Comments    Pt in bed upon arrival.  C/o stomach cramping from suppository .  Assisted to EOB with min a x 1.  Stood to walker with min a x 1 but required mod a x 1 once standing due to unsteadiness and poor awareness as she is trying to turn before gaining balance.  To commode for hard formed BM.  RN in in to check.  After pivoting back to bed, she takes a short rest and wants to try to walk.  She stands and takes a few steps before becoming fearful about getting out of breath and begins looking for a chair.  Chair was brought behind her to sit.  asssited back to bed per her request.  She stated she often runs from room to room and sits quickly as she fatigues out and feels out of breath.  She stated she lives alone but her daughter is coming to help out.  On further questioning daughter had a severely handicapped 61 yo with Muscular Dystrophy who needs 24 hour care.  Stated they both will be coming to stay with her.  Discussed option of SNF but she is resistant stating she wants to go home.  She is obtaining a wheelchair and has other equipment needed.  She has a rollator walker and recommended a RW for safety. She will require 24 hour care if discharging home.  Will update recommendations to SNF as she will benefit.  Daughter will need to know extent of her care needed before discharge to make sure she can handle being a caregiver to both for an extended period of time.   Follow Up Recommendations  SNF;Other (comment)     Equipment  Recommendations  Rolling walker   Recommendations for Other Services       Precautions / Restrictions Precautions Precautions: None Restrictions Weight Bearing Restrictions: Yes LLE Weight Bearing: Weight bearing as tolerated    Mobility  Bed Mobility Overal bed mobility: Needs Assistance Bed Mobility: Supine to Sit;Sit to Supine     Supine to sit: Min assist Sit to supine: Mod assist      Transfers Overall transfer level: Needs assistance Equipment used: Rolling walker (2 wheeled) Transfers: Sit to/from Stand Sit to Stand: Mod assist;Min assist         General transfer comment: generally unsteady with poor awareness  Ambulation/Gait Ambulation/Gait assistance: Min assist Gait Distance (Feet): 5 Feet Assistive device: Rolling walker (2 wheeled) Gait Pattern/deviations: Step-through pattern;Decreased step length - right;Decreased step length - left;Narrow base of support Gait velocity: decreased   General Gait Details: generally unsteady linited by pain,   Stairs             Wheelchair Mobility    Modified Rankin (Stroke Patients Only)       Balance Overall balance assessment: Needs assistance Sitting-balance support: Feet supported Sitting balance-Leahy Scale: Fair Sitting balance - Comments: supervision for general safety due to malaise.   Standing balance support: Bilateral upper extremity supported Standing balance-Leahy Scale: Poor  Cognition Arousal/Alertness: Awake/alert Behavior During Therapy: WFL for tasks assessed/performed Overall Cognitive Status: Within Functional Limits for tasks assessed                                        Exercises Other Exercises Other Exercises: to commode for BM    General Comments        Pertinent Vitals/Pain Pain Assessment: Faces Faces Pain Scale: Hurts little more Pain Location: L Hip Pain Descriptors / Indicators:  Aching;Sore Pain Intervention(s): Limited activity within patient's tolerance;Monitored during session;Repositioned    Home Living                      Prior Function            PT Goals (current goals can now be found in the care plan section) Progress towards PT goals: Progressing toward goals    Frequency    BID      PT Plan Discharge plan needs to be updated    Co-evaluation              AM-PAC PT "6 Clicks" Mobility   Outcome Measure  Help needed turning from your back to your side while in a flat bed without using bedrails?: A Little Help needed moving from lying on your back to sitting on the side of a flat bed without using bedrails?: A Little Help needed moving to and from a bed to a chair (including a wheelchair)?: A Little Help needed standing up from a chair using your arms (e.g., wheelchair or bedside chair)?: A Little Help needed to walk in hospital room?: A Little Help needed climbing 3-5 steps with a railing? : A Lot 6 Click Score: 17    End of Session Equipment Utilized During Treatment: Gait belt Activity Tolerance: Patient limited by fatigue Patient left: in bed;with bed alarm set;with call bell/phone within reach Nurse Communication: Mobility status Pain - Right/Left: Left Pain - part of body: Hip     Time: 6761-9509 PT Time Calculation (min) (ACUTE ONLY): 16 min  Charges:  $Therapeutic Activity: 8-22 mins                    Chesley Noon, PTA 12/01/20, 2:47 PM

## 2020-12-01 NOTE — Progress Notes (Signed)
PT Cancellation Note  Patient Details Name: Katelyn Lee MRN: [id number] DOB: [birth year]   Cancelled Treatment:    Reason Eval/Treat Not Completed: Fatigue/lethargy limiting ability to participate   Pt in bed after using commode.  Stated she got "weak" on commode and generally feeling poorly this am.  Declined further OOB this am.  Encouraged exercises and she stated she could do some with her hands but that was it.  Will return after lunch.   Chesley Noon 12/01/2020, 12:50 PM

## 2020-12-01 NOTE — Progress Notes (Signed)
Occupational Therapy Treatment Patient Details Name: Katelyn Lee MRN: [id number] DOB: Feb 13, 1936 Today's Date: 12/01/2020    History of present illness Pt. is an 84 y.o. female who was admitted to [facility] for IM nailing repair of a left closed nondisplaced intertrochanteric Femur Fx. Pt. sustained the Femur fx during a fall down the outside steps 10 days prior to being admitted for surgery.  PMH includes: A-fib, dyspnea, dysrhythmia, goiter, insomnia, GERD, and SOB.   OT comments  Katelyn Lee was seen for OT treatment on this date. Upon arrival to room pt awake/alert, semi-supine in bed, and agreeable to OT tx session. Pt initially declines pain in her LLE, however pain increases with functional mobility. Pt continues to be functionally limited by increased pain in her LLE, decreased safety awareness, and decreased AROM. She requires MIN A to don hospital sock on her LLE with use of sock aid for improved functional independence. Additional pt education provided on safe use of AE/DME for ADL management. Pt/Caregiver (DIL presents to bedside at end of session) educated on role of OT in acute care vs STR setting. Both return verbalize understanding of education provided. See ADL section below for additional detail regarding pt occupational performance during session. Pt making limited progress toward therapy goals, and remains pain limited with decreased insight into deficits this date. Pt continues to benefit from skilled OT services to maximize return to PLOF and minimize risk of future falls, injury, caregiver burden, and readmission. DC recommendation updated to STR to maximize pt safety and return to PLOF. POC remains appropriate at this time.    Follow Up Recommendations  SNF;Supervision - Intermittent    Equipment Recommendations  3 in 1 bedside commode    Recommendations for Other Services      Precautions / Restrictions Precautions Precautions: Fall Restrictions Weight Bearing  Restrictions: Yes LLE Weight Bearing: Weight bearing as tolerated       Mobility Bed Mobility Overal bed mobility: Needs Assistance Bed Mobility: Supine to Sit;Sit to Supine     Supine to sit: Min assist Sit to supine: Mod assist      Transfers Overall transfer level: Needs assistance Equipment used: Rolling walker (2 wheeled) Transfers: Sit to/from Stand Sit to Stand: Min assist         General transfer comment: generally unsteady with poor safety awareness, requires MIN A for STS from elevated bed.    Balance Overall balance assessment: Needs assistance Sitting-balance support: Feet supported;Single extremity supported;No upper extremity supported Sitting balance-Leahy Scale: Good Sitting balance - Comments: Pt able to sit EOB w/o UE support during functional activity. No LOB appreciated during session.   Standing balance support: During functional activity;Bilateral upper extremity supported Standing balance-Leahy Scale: Poor Standing balance comment: Req. intermittent MIN A to maintain standing balance.                           ADL either performed or assessed with clinical judgement   ADL                   Upper Body Dressing : Supervision/safety;Set up;Sitting   Lower Body Dressing: Cueing for safety;With adaptive equipment;Sit to/from stand;Moderate assistance Lower Body Dressing Details (indicate cue type and reason): Pt return demonstrates use of LH reacher and sock aid to don sock on operative extremity. Toilet Transfer: Minimal assistance;BSC;RW;Stand-pivot   Toileting- Water quality scientist and Hygiene: Moderate assistance;Sit to/from stand       Functional mobility during  ADLs: Moderate assistance;Rolling walker;Cueing for safety;Cueing for sequencing       Vision Baseline Vision/History: Wears glasses Patient Visual Report: No change from baseline     Perception     Praxis      Cognition Arousal/Alertness:  Awake/alert Behavior During Therapy: WFL for tasks assessed/performed Overall Cognitive Status: Within Functional Limits for tasks assessed                                 General Comments: Pt very fearful of falling during session.        Exercises Other Exercises Other Exercises: to commode for BM Other Exercises: OT facilitates reinforcement of prior education on safe use of AE/DME for ADL management. Pt engaged in therapeutic activity for safe use of LH reacher and sock aid to doff/don hospital sock on LLE this date. See ADL section for additional detail regarding occupational performance.   Shoulder Instructions       General Comments      Pertinent Vitals/ Pain       Pain Assessment: Faces Faces Pain Scale: Hurts even more Pain Location: L Hip with mobility. Pain Descriptors / Indicators: Aching;Sore;Grimacing Pain Intervention(s): Limited activity within patient's tolerance;Monitored during session;Repositioned;Utilized relaxation techniques  Home Living                                          Prior Functioning/Environment              Frequency  Min 2X/week        Progress Toward Goals  OT Goals(current goals can now be found in the care plan section)  Progress towards OT goals: Progressing toward goals  Acute Rehab OT Goals Patient Stated Goal: To go home/ OT Goal Formulation: With patient Potential to Achieve Goals: Good  Plan Discharge plan needs to be updated;Frequency remains appropriate    Co-evaluation                 AM-PAC OT "6 Clicks" Daily Activity     Outcome Measure   Help from another person eating meals?: A Little Help from another person taking care of personal grooming?: A Little Help from another person toileting, which includes using toliet, bedpan, or urinal?: A Lot Help from another person bathing (including washing, rinsing, drying)?: A Lot Help from another person to put on and  taking off regular upper body clothing?: A Little Help from another person to put on and taking off regular lower body clothing?: A Lot 6 Click Score: 15    End of Session Equipment Utilized During Treatment: Gait belt;Rolling walker (LH reacher, sock aid.)  OT Visit Diagnosis: Muscle weakness (generalized) (M62.81)   Activity Tolerance Patient tolerated treatment well   Patient Left in bed;with call bell/phone within reach;with bed alarm set;with family/visitor present   Nurse Communication          Time: 5883-2549 OT Time Calculation (min): 34 min  Charges: OT General Charges $OT Visit: 1 Visit OT Treatments $Self Care/Home Management : 8-22 mins $Therapeutic Activity: 8-22 mins  Shara Blazing, M.S., OTR/L Ascom: [phone] 12/01/20, 4:12 PM

## 2020-12-01 NOTE — Plan of Care (Signed)

## 2020-12-01 NOTE — Progress Notes (Signed)
   Subjective: 3 Days Post-Op Procedure(s) (LRB): INTRAMEDULLARY (IM) NAIL INTERTROCHANTRIC (Left) Patient reports pain as mild to moderate.  She feels as if pain is inhibiting her ability to walk or physical therapy.  She has been taking very little narcotic pain medications. Patient is well, and has had no acute complaints or problems Denies any CP, SOB, ABD pain. We will continue therapy today.   Objective: Vital signs in last 24 hours: Temp:  [97.9 F (36.6 C)-98.7 F (37.1 C)] 97.9 F (36.6 C) (12/13 0410) Pulse Rate:  [75-80] 77 (12/13 0410) Resp:  [15-19] 15 (12/13 0410) BP: (101-134)/(58-80) 106/65 (12/13 0410) SpO2:  [96 %-100 %] 100 % (12/13 0410)  Intake/Output from previous day: 12/12 0701 - 12/13 0700 In: 360 [P.O.:360] Out: 250 [Urine:250] Intake/Output this shift: No intake/output data recorded.  Recent Labs    11/29/20 0427 11/30/20 0519 12/01/20 0452  HGB 9.5* 8.8* 8.3*   Recent Labs    11/30/20 0519 12/01/20 0452  WBC 7.4 6.8  RBC 3.02* 2.80*  HCT 27.3* 25.2*  PLT 202 199   Recent Labs    11/30/20 0519 12/01/20 0452  NA 136 136  K 4.1 4.0  CL 105 104  CO2 25 24  BUN 20 21  CREATININE 0.98 0.88  GLUCOSE 99 105*  CALCIUM 8.4* 8.4*   No results for input(s): LABPT, INR in the last 72 hours.  EXAM General - Patient is Alert, Appropriate and Oriented Extremity - Neurovascular intact Sensation intact distally Intact pulses distally Dorsiflexion/Plantar flexion intact No cellulitis present Compartment soft Dressing -distal incision site intact, dressing clean dry and intact.  Proximal incision site covered with foam tape.  Dressing is clean dry and intact.  No significant swelling throughout the thigh. Motor Function - intact, moving foot and toes well on exam.   Past Medical History:  Diagnosis Date  . A-fib ([place])   . Arthritis    legs, lower back  . Dyspnea   . Dysrhythmia    A-FIB  . Goiter   . Insomnia   . Stroke ([place])  07/29/2017  . Vertigo   . Wears dentures    full upper and lower  . Wears hearing aid in both ears     Assessment/Plan:   3 Days Post-Op Procedure(s) (LRB): INTRAMEDULLARY (IM) NAIL INTERTROCHANTRIC (Left) Principal Problem:   Closed nondisplaced intertrochanteric fracture of left femur, initial encounter (HCC) Active Problems:   History of stroke   Paroxysmal A-fib (HCC)   Mixed hyperlipidemia  Estimated body mass index is 27.82 kg/m as calculated from the following:   Height as of this encounter: 5\' 8"  (1.727 m).   Weight as of this encounter: 83 kg. Advance diet Up with therapy  Vital signs are stable Pain well controlled Hemoglobin 8.3.  Continue iron supplement.  Recheck hemoglobin in the morning Care management to assist with discharge to skilled nursing facility.   DVT Prophylaxis - Xarelto, TED hose and SCDs Weight-Bearing as tolerated to left leg   Katelyn Dixon, PA-C [place] 12/01/2020, 6:48 AM

## 2020-12-01 NOTE — TOC Initial Note (Signed)
Transition of Care [facility]) - Initial/Assessment Note    Patient Details  Name: Katelyn Lee MRN: [id number] Date of Birth: 1935/12/29  Transition of Care [facility]) CM/SW Contact:    Shelbie Ammons, RN Phone Number: 12/01/2020, 1:13 PM  Clinical Narrative:   RNCM met with patient at bedside, patient resting in bed with eyes closed but easily arousable. Patient is from home alone but reports to having numerous friends, family and church members that will help her when she goes home. She reports that she has a walker, cane and BSC and her son is working on obtaining a wheelchair for when she gets home. Patient reports that she will not consider going to a facility. RNCM discussed home health and patient is agreeable.  RNCM reached out to both G. L. Garci­a with [facility] and Helene Kelp with [facility] and neither can accept due to patient's address.  RNCM reached out to Anton Ruiz with [facility] Pellegrini with Darrick Meigs with Wilber Bihari with [facility] and none of those can accept due to staffing and/or other issues.       Expected Discharge Plan: [place] Barriers to Discharge: No [facility] will accept this patient   Patient Goals and CMS Choice        Expected Discharge Plan and Services Expected Discharge Plan: [place]       Living arrangements for the past 2 months: Single Family Home                                      Prior Living Arrangements/Services Living arrangements for the past 2 months: Single Family Home Lives with:: Self Patient language and need for interpreter reviewed:: Yes Do you feel safe going back to the place where you live?: Yes      Need for Family Participation in Patient Care: Yes (Comment) Care giver support system in place?: Yes (comment)   Criminal Activity/Legal Involvement Pertinent to Current Situation/Hospitalization: No - Comment as needed  Activities of Daily Living Home Assistive Devices/Equipment:  Walker (specify type) ADL Screening (condition at time of admission) Patient's cognitive ability adequate to safely complete daily activities?: Yes Is the patient deaf or have difficulty hearing?: Yes Does the patient have difficulty seeing, even when wearing glasses/contacts?: No Does the patient have difficulty concentrating, remembering, or making decisions?: Yes Patient able to express need for assistance with ADLs?: Yes Does the patient have difficulty dressing or bathing?: No Independently performs ADLs?: Yes (appropriate for developmental age) Does the patient have difficulty walking or climbing stairs?: Yes Weakness of Legs: Both Weakness of Arms/Hands: None  Permission Sought/Granted                  Emotional Assessment Appearance:: Appears stated age Attitude/Demeanor/Rapport: Engaged Affect (typically observed): Appropriate,Calm Orientation: : Oriented to Self,Oriented to Place,Oriented to  Time,Oriented to Situation Alcohol / Substance Use: Not Applicable Psych Involvement: No (comment)  Admission diagnosis:  Left hip pain [M25.552] Anticoagulated [Z79.01] Closed nondisplaced intertrochanteric fracture of left femur, initial encounter [facility]) [S72.145A] Patient Active Problem List   Diagnosis Date Noted  . Closed nondisplaced intertrochanteric fracture of left femur, initial encounter ([place]) 11/27/2020  . History of stroke 08/24/2017  . Paroxysmal A-fib ([place]) 03/28/2015  . Mixed hyperlipidemia 03/28/2015   PCP:  Sofie Hartigan, MD Pharmacy:   [id number] [place], [address]  [place] [address] Phone: [phone] Fax: [phone]     Social Determinants of Health (SDOH) Interventions    Readmission Risk Interventions No flowsheet data found.

## 2020-12-01 NOTE — Progress Notes (Addendum)
Progress Note    [name]  YQM:[id number] DOB: 27-Aug-1936  DOA: 11/27/2020 PCP: Sofie Hartigan, MD      Brief Narrative:    Medical records reviewed and are as summarized below:  Katelyn Katelyn Lee is a 84 y.o. female       Assessment/Plan:   Principal Problem:   Closed nondisplaced intertrochanteric fracture of left femur, initial encounter ([place]) Active Problems:   History of stroke   Paroxysmal A-fib ([place])   Mixed hyperlipidemia   Body mass index is 27.82 kg/m.   Left intertrochanteric hip fracture: s/p intramedullary nailing of left femur on 11/28/2020.  Continue analgesics, PT and OT.  Follow-up with orthopedic surgeon.    Acute blood loss anemia: No indication for blood transfusion at this time.  Monitor H&H.  Hypotension: Asymptomatic.  Monitor BP closely.  Start treatment with IV fluids if hypotension persists.  Paroxysmal atrial fibrillation and history of stroke: Continue Xarelto  Vitamin D deficiency: Vitamin D level is 21.57.  Continue vitamin D supplement  Hyperlipidemia: Continue rosuvastatin  Anxiety: She is on sertraline.  Social worker is working on disposition.  Home health therapy versus SNF.  Patient prefers to go home.   Diet Order            Diet regular Room service appropriate? Yes; Fluid consistency: Thin  Diet effective now                    Consultants:  Orthopedic surgeon, Dr. Leim Fabry  Procedures:  Left hip surgery on 11/28/2020    Medications:   . acetaminophen  1,000 mg Oral Q8H  . cholecalciferol  1,000 Units Oral Daily  . docusate sodium  100 mg Oral BID  . ferrous [id number] C-folic acid  1 capsule Oral BID  . rivaroxaban  20 mg Oral QPC breakfast  . rosuvastatin  5 mg Oral QPC breakfast  . sertraline  150 mg Oral QPC breakfast   Continuous Infusions: . methocarbamol (ROBAXIN) IV       Anti-infectives (From admission, onward)   Start     Dose/Rate Route Frequency Ordered  Stop   11/28/20 2000  ceFAZolin (ANCEF) IVPB 2g/100 mL premix        2 g 200 mL/hr over 30 Minutes Intravenous Every 6 hours 11/28/20 1807 11/29/20 0924   11/28/20 0600  ceFAZolin (ANCEF) IVPB 2g/100 mL premix        2 g 200 mL/hr over 30 Minutes Intravenous On call to O.R. 11/27/20 2252 11/28/20 1536             Family Communication/Anticipated D/C date and plan/Code Status   DVT prophylaxis: SCDs Start: 11/28/20 1807 rivaroxaban (XARELTO) tablet 20 mg     Code Status: DNR  Family Communication: None Disposition Plan:    Status is: Inpatient  Remains inpatient appropriate because:Unsafe d/c plan and Inpatient level of care appropriate due to severity of illness   Dispo: The patient is from: Home              Anticipated d/c is to: SNF              Anticipated d/c date is: 1 day              Patient currently is not medically stable to d/c.           Subjective:   C/o left hip pain but not as bad as yeaterday  Objective:  Vitals:   12/01/20 0020 12/01/20 0410 12/01/20 0748 12/01/20 1147  BP: 108/63 106/65 119/61 (!) 86/52  Pulse: 75 77 68 69  Resp: 16 15 18 18   Temp: 98.7 F (37.1 C) 97.9 F (36.6 C) 98.1 F (36.7 C) 97.8 F (36.6 C)  TempSrc: Oral Oral Oral Oral  SpO2: 97% 100% 98% 99%  Weight:      Height:       No data found.   Intake/Output Summary (Last 24 hours) at 12/01/2020 1442 Last data filed at 12/01/2020 1408 Gross per 24 hour  Intake 480 ml  Output 250 ml  Net 230 ml   Filed Weights   11/27/20 1745  Weight: 83 kg    Exam:  GEN: NAD SKIN: Warm and dry EYES: No pallor or icterus ENT: MMM CV: RRR PULM: CTA B ABD: soft, ND, NT, +BS CNS: AAO x 3, non focal EXT: Left hip surgical tenderness. Dressing on left hip wound surgical wound is intact with dried blood stain     Data Reviewed:   I have personally reviewed following labs and imaging studies:  Labs: Labs show the following:   Basic Metabolic  Panel: Recent Labs  Lab 11/27/20 1813 11/28/20 0513 11/29/20 0427 11/30/20 0519 12/01/20 0452  NA 136 139 138 136 136  K 4.8 4.5 4.5 4.1 4.0  CL 104 105 106 105 104  CO2 24 25 23 25 24   GLUCOSE 98 94 145* 99 105*  BUN 21 18 23 20 21   CREATININE 1.00 0.87 1.07* 0.98 0.88  CALCIUM 8.8* 8.9 8.7* 8.4* 8.4*   GFR Estimated Creatinine Clearance: 55.6 mL/min (by C-G formula based on SCr of 0.88 mg/dL). Liver Function Tests: Recent Labs  Lab 11/27/20 1813  AST 28  ALT 19  ALKPHOS 75  BILITOT 0.7  PROT 6.8  ALBUMIN 3.6   No results for input(s): LIPASE, AMYLASE in the last 168 hours. No results for input(s): AMMONIA in the last 168 hours. Coagulation profile Recent Labs  Lab 11/27/20 1813  INR 2.7*    CBC: Recent Labs  Lab 11/27/20 1813 11/28/20 0513 11/29/20 0427 11/30/20 0519 12/01/20 0452  WBC 7.2 4.7 6.5 7.4 6.8  NEUTROABS 4.8  --   --   --   --   HGB 11.6* 11.0* 9.5* 8.8* 8.3*  HCT 36.6 33.7* 28.8* 27.3* 25.2*  MCV 93.1 90.8 89.7 90.4 90.0  PLT 222 229 207 202 199   Cardiac Enzymes: No results for input(s): CKTOTAL, CKMB, CKMBINDEX, TROPONINI in the last 168 hours. BNP (last 3 results) No results for input(s): PROBNP in the last 8760 hours. CBG: Recent Labs  Lab 11/29/20 1211  GLUCAP 94   D-Dimer: No results for input(s): DDIMER in the last 72 hours. Hgb A1c: No results for input(s): HGBA1C in the last 72 hours. Lipid Profile: No results for input(s): CHOL, HDL, LDLCALC, TRIG, CHOLHDL, LDLDIRECT in the last 72 hours. Thyroid function studies: No results for input(s): TSH, T4TOTAL, T3FREE, THYROIDAB in the last 72 hours.  Invalid input(s): FREET3 Anemia work up: No results for input(s): VITAMINB12, FOLATE, FERRITIN, TIBC, IRON, RETICCTPCT in the last 72 hours. Sepsis Labs: Recent Labs  Lab 11/28/20 0513 11/29/20 0427 11/30/20 0519 12/01/20 0452  WBC 4.7 6.5 7.4 6.8    Microbiology Recent Results (from the past 240 hour(s))  Resp Panel  by RT-PCR (Flu A&B, Covid) Nasopharyngeal Swab     Status: None   Collection Time: 11/27/20  6:13 PM   Specimen: Nasopharyngeal Swab;  Nasopharyngeal(NP) swabs in vial transport medium  Result Value Ref Range Status   SARS Coronavirus 2 by RT PCR NEGATIVE NEGATIVE Final    Comment: (NOTE) SARS-CoV-2 target nucleic acids are NOT DETECTED.  The SARS-CoV-2 RNA is generally detectable in upper respiratory specimens during the acute phase of infection. The lowest concentration of SARS-CoV-2 viral copies this assay can detect is 138 copies/mL. A negative result does not preclude SARS-Cov-2 infection and should not be used as the sole basis for treatment or other patient management decisions. A negative result may occur with  improper specimen collection/handling, submission of specimen other than nasopharyngeal swab, presence of viral mutation(s) within the areas targeted by this assay, and inadequate number of viral copies(<138 copies/mL). A negative result must be combined with clinical observations, patient history, and epidemiological information. The expected result is Negative.  Fact Sheet for Patients:  EntrepreneurPulse.com.au  Fact Sheet for Healthcare Providers:  IncredibleEmployment.be  This test is no t yet approved or cleared by the [place] FDA and  has been authorized for detection and/or diagnosis of SARS-CoV-2 by FDA under an Emergency Use Authorization (EUA). This EUA will remain  in effect (meaning this test can be used) for the duration of the COVID-19 declaration under Section 564(b)(1) of the Act, 21 U.S.C.section 360bbb-3(b)(1), unless the authorization is terminated  or revoked sooner.       Influenza A by PCR NEGATIVE NEGATIVE Final   Influenza B by PCR NEGATIVE NEGATIVE Final    Comment: (NOTE) The Xpert Xpress SARS-CoV-2/FLU/RSV plus assay is intended as an aid in the diagnosis of influenza from Nasopharyngeal swab  specimens and should not be used as a sole basis for treatment. Nasal washings and aspirates are unacceptable for Xpert Xpress SARS-CoV-2/FLU/RSV testing.  Fact Sheet for Patients: EntrepreneurPulse.com.au  Fact Sheet for Healthcare Providers: IncredibleEmployment.be  This test is not yet approved or cleared by the [place] FDA and has been authorized for detection and/or diagnosis of SARS-CoV-2 by FDA under an Emergency Use Authorization (EUA). This EUA will remain in effect (meaning this test can be used) for the duration of the COVID-19 declaration under Section 564(b)(1) of the Act, 21 U.S.C. section 360bbb-3(b)(1), unless the authorization is terminated or revoked.  Performed at [facility], [address] [place], [place] [address]     Procedures and diagnostic studies:  No results found.             LOS: 4 days   Hollyanne Schloesser  [facility]   Pager on [email or web address]. If 7PM-7AM, please contact night-coverage at [email or web address]     12/01/2020, 2:42 PM

## 2020-12-02 LAB — CBC WITH DIFFERENTIAL/PLATELET
Abs Immature Granulocytes: 0.02 10*3/uL (ref 0.00–0.07)
Basophils Absolute: 0 10*3/uL (ref 0.0–0.1)
Basophils Relative: 1 %
Eosinophils Absolute: 0.3 10*3/uL (ref 0.0–0.5)
Eosinophils Relative: 5 %
HCT: 26.2 % — ABNORMAL LOW (ref 36.0–46.0)
Hemoglobin: 8.7 g/dL — ABNORMAL LOW (ref 12.0–15.0)
Immature Granulocytes: 0 %
Lymphocytes Relative: 24 %
Lymphs Abs: 1.5 10*3/uL (ref 0.7–4.0)
MCH: 29.7 pg (ref 26.0–34.0)
MCHC: 33.2 g/dL (ref 30.0–36.0)
MCV: 89.4 fL (ref 80.0–100.0)
Monocytes Absolute: 0.5 10*3/uL (ref 0.1–1.0)
Monocytes Relative: 9 %
Neutro Abs: 3.9 10*3/uL (ref 1.7–7.7)
Neutrophils Relative %: 61 %
Platelets: 220 10*3/uL (ref 150–400)
RBC: 2.93 MIL/uL — ABNORMAL LOW (ref 3.87–5.11)
RDW: 13.8 % (ref 11.5–15.5)
WBC: 6.3 10*3/uL (ref 4.0–10.5)
nRBC: 0 % (ref 0.0–0.2)

## 2020-12-02 MED ORDER — SODIUM CHLORIDE 0.9 % IV SOLN: INTRAVENOUS | Status: AC

## 2020-12-02 MED ORDER — SODIUM CHLORIDE 0.9 % IV BOLUS
500.0000 mL | Freq: Once | INTRAVENOUS | Status: AC
  Administered 2020-12-02: 12:00:00 500 mL via INTRAVENOUS

## 2020-12-02 MED ORDER — POLYETHYLENE GLYCOL 3350 17 G PO PACK
17.0000 g | PACK | Freq: Every day | ORAL | Status: DC
  Administered 2020-12-02: 12:00:00 17 g via ORAL
  Filled 2020-12-02: qty 1

## 2020-12-02 NOTE — TOC Progression Note (Addendum)
Transition of Care [facility]) - Progression Note    Patient Details  Name: Katelyn Lee MRN: [id number] Date of Birth: 1936/02/04  [facility]) CM/SW Contact  Shelbie Ammons, RN Phone Number: 12/02/2020, 9:05 AM  Clinical Narrative:     9:00am- RNCM met with patient in room. Attempted to discuss with patient ongoing recommendations for 24 hour/SNF care. Patient remains adamant that she is going home and her daughter will come stay with her. She reports that she can't and won't go into a facility. Discussed with patient that at this time RNCM has reached out to all available home health agencies and no one is able to accept her for physical therapy in home. Patient reports that she will manage and her family and church family will help. She reports that she knows her son has already obtained a wheel chair for her. RNCM inquired if she can contact her daughter and patient provided contact for Dory Larsen at [phone].  RNCM reached out to patient's daughter Santiago Glad to discuss, Santiago Glad reports that she has told her mother that she will try to come up and help for the weekend but that she is unsure if she can stay for an extended amount of time. She was initially extremely upset when this RNCM attempted to explain that patient's Medicare will not cover around the clock care but then with explanation verbalized understanding. She reported that she would get in contact with her brothers and they would formulate a plan for patient at home and she would get back in touch.   10:30am- RNCM received call back from both patient's daughter Santiago Glad as well as DIL Maudie Mercury, both are reporting that it is their families opinion that patient will likely need SNF at discharge. Discussed with both that because patient is of sound mind it is her decision if she wants to go home and this CM can't search for bed without her approval. They have plans to talk with patient and then get back in touch.     Expected  Discharge Plan: [place] Barriers to Discharge: No [facility] will accept this patient  Expected Discharge Plan and Services Expected Discharge Plan: [place] arrangements for the past 2 months: Single Family Home                                       Social Determinants of Health (SDOH) Interventions    Readmission Risk Interventions No flowsheet data found.

## 2020-12-02 NOTE — Progress Notes (Signed)
   Subjective: 4 Days Post-Op Procedure(s) (LRB): INTRAMEDULLARY (IM) NAIL INTERTROCHANTRIC (Left) Patient reports pain as mild to moderate.  She feels as if pain is inhibiting her ability to walk or physical therapy.  She has been taking very little narcotic pain medications. Patient is well, and has had no acute complaints or problems Denies any CP, SOB, ABD pain. We will continue therapy today.   Objective: Vital signs in last 24 hours: Temp:  [97.6 F (36.4 C)-98.4 F (36.9 C)] 97.6 F (36.4 C) (12/14 0356) Pulse Rate:  [68-74] 72 (12/14 0356) Resp:  [15-18] 15 (12/14 0356) BP: (86-119)/(52-66) 117/62 (12/14 0356) SpO2:  [96 %-100 %] 96 % (12/14 0356)  Intake/Output from previous day: 12/13 0701 - 12/14 0700 In: 360 [P.O.:360] Out: 350 [Urine:350] Intake/Output this shift: Total I/O In: -  Out: 350 [Urine:350]  Recent Labs    11/30/20 0519 12/01/20 0452 12/02/20 0419  HGB 8.8* 8.3* 8.7*   Recent Labs    12/01/20 0452 12/02/20 0419  WBC 6.8 6.3  RBC 2.80* 2.93*  HCT 25.2* 26.2*  PLT 199 220   Recent Labs    11/30/20 0519 12/01/20 0452  NA 136 136  K 4.1 4.0  CL 105 104  CO2 25 24  BUN 20 21  CREATININE 0.98 0.88  GLUCOSE 99 105*  CALCIUM 8.4* 8.4*   No results for input(s): LABPT, INR in the last 72 hours.  EXAM General - Patient is Alert, Appropriate and Oriented Extremity - Neurovascular intact Sensation intact distally Intact pulses distally Dorsiflexion/Plantar flexion intact No cellulitis present Compartment soft Dressing -distal incision site intact, dressing clean dry and intact.  Proximal incision site covered with foam tape.  Dressing is clean dry and intact.  No significant swelling throughout the thigh. Motor Function - intact, moving foot and toes well on exam.   Past Medical History:  Diagnosis Date  . A-fib ([place])   . Arthritis    legs, lower back  . Dyspnea   . Dysrhythmia    A-FIB  . Goiter   . Insomnia   . Stroke  ([place]) 07/29/2017  . Vertigo   . Wears dentures    full upper and lower  . Wears hearing aid in both ears     Assessment/Plan:   4 Days Post-Op Procedure(s) (LRB): INTRAMEDULLARY (IM) NAIL INTERTROCHANTRIC (Left) Principal Problem:   Closed nondisplaced intertrochanteric fracture of left femur, initial encounter (HCC) Active Problems:   History of stroke   Paroxysmal A-fib (HCC)   Mixed hyperlipidemia  Estimated body mass index is 27.82 kg/m as calculated from the following:   Height as of this encounter: 5\' 8"  (1.727 m).   Weight as of this encounter: 83 kg. Advance diet Up with therapy  Vital signs are stable Pain well controlled Hemoglobin 8.7.  Continue iron supplement.  Recheck hemoglobin in the morning Care management to assist with discharge to skilled nursing facility. Patient had a bowel movement   DVT Prophylaxis - Xarelto, TED hose and SCDs Weight-Bearing as tolerated to left leg   Reche Dixon, PA-C [place] 12/02/2020, 6:19 AM

## 2020-12-02 NOTE — Progress Notes (Signed)
Physical Therapy Treatment Patient Details Name: Katelyn Lee MRN: [id number] DOB: [birth year] Today's Date: 12/02/2020    History of Present Illness Pt. is an 84 y.o. female who was admitted to [facility] for IM nailing repair of a left closed nondisplaced intertrochanteric Femur Fx. Pt. sustained the Femur fx during a fall down the outside steps 10 days prior to being admitted for surgery.  PMH includes: A-fib, dyspnea, dysrhythmia, goiter, insomnia, GERD, and SOB.    PT Comments    Pt premedicated prior to session, received in supine and agreeable to PT services.  Pt required MinA with HOB raised to scoot to edge of bed.  Sitting EOB x 15 minutes.  Several attempts to practice standing from bed to RW.  Pt with poor tolerance due to c/o dizziness and request to sit back down on bed.  Pt sitting BP 94/49  HR 89bpm,  Standing BP after 20 seconds decreased to 68/50  HR 99bpm.  Pt symptomatic and unable to tolerate standing activity.  Nursing notified. Pt assisted back to supine and placed on bed pan to avoid event while sitting on commode.  Pt left with call bell with bed in lowest position.  Follow Up Recommendations  SNF;Other (comment)     Equipment Recommendations  Rolling walker with 5" wheels    Recommendations for Other Services       Precautions / Restrictions Precautions Precautions: Fall Precaution Comments: dizziness with positional changes Restrictions Weight Bearing Restrictions: Yes LLE Weight Bearing: Weight bearing as tolerated    Mobility  Bed Mobility Overal bed mobility: Needs Assistance Bed Mobility: Supine to Sit;Sit to Supine     Supine to sit: Min assist;HOB elevated Sit to supine: Min assist   General bed mobility comments: assist for L LE management  Transfers Overall transfer level: Needs assistance Equipment used: Rolling walker (2 wheeled) Transfers: Sit to/from Stand Sit to Stand: Min assist         General transfer comment: generally unsteady  with poor safety awareness, requires MIN A for STS from elevated bed.  Ambulation/Gait             General Gait Details: Unable to tolerate ambulation due to drop in BP   Stairs             Wheelchair Mobility    Modified Rankin (Stroke Patients Only)       Balance                                            Cognition Arousal/Alertness: Awake/alert Behavior During Therapy: WFL for tasks assessed/performed Overall Cognitive Status: Within Functional Limits for tasks assessed                                 General Comments: Orthostatic Hypotension after standing for ~20 seconds      Exercises      General Comments General comments (skin integrity, edema, etc.): Pt unable to progress functional mobility due to drop in BP and symptomatic.  Sitting BP 94/49  HR 89bpm, Standing after 20 seconds BP dropped to 68/50  HR 99bpm, nursing notified      Pertinent Vitals/Pain Pain Assessment: 0-10 Pain Score: 3  Pain Location: L Hip with mobility. Pain Descriptors / Indicators: Aching;Sore;Grimacing Pain Intervention(s): Premedicated before session    Home  Living                      Prior Function            PT Goals (current goals can now be found in the care plan section) Acute Rehab PT Goals Patient Stated Goal: To go home/    Frequency    BID      PT Plan      Co-evaluation              AM-PAC PT "6 Clicks" Mobility   Outcome Measure  Help needed turning from your back to your side while in a flat bed without using bedrails?: A Little Help needed moving from lying on your back to sitting on the side of a flat bed without using bedrails?: A Little Help needed moving to and from a bed to a chair (including a wheelchair)?: A Little Help needed standing up from a chair using your arms (e.g., wheelchair or bedside chair)?: A Little Help needed to walk in hospital room?: A Little Help needed climbing  3-5 steps with a railing? : A Lot 6 Click Score: 17    End of Session Equipment Utilized During Treatment: Gait belt Activity Tolerance:  (Limited by drop in BP with positional changes) Patient left: in bed;with bed alarm set;with call bell/phone within reach Nurse Communication: Mobility status;Other (comment) (BP/HR changes) PT Visit Diagnosis: Unsteadiness on feet (R26.81);Other abnormalities of gait and mobility (R26.89);Repeated falls (R29.6);Muscle weakness (generalized) (M62.81);History of falling (Z91.81);Difficulty in walking, not elsewhere classified (R26.2);Pain Pain - Right/Left: Left Pain - part of body: Hip     Time: 2500-3704 PT Time Calculation (min) (ACUTE ONLY): 45 min  Charges:  $Therapeutic Activity: 38-52 mins                     Mikel Cella, PTA   Josie Dixon 12/02/2020, 11:31 AM

## 2020-12-02 NOTE — Progress Notes (Signed)
Physical Therapy Treatment Patient Details Name: Katelyn Lee MRN: [id number] DOB: 1936-09-28 Today's Date: 12/02/2020    History of Present Illness Pt. is an 84 y.o. female who was admitted to [facility] for IM nailing repair of a left closed nondisplaced intertrochanteric Femur Fx. Pt. sustained the Femur fx during a fall down the outside steps 10 days prior to being admitted for surgery.  PMH includes: A-fib, dyspnea, dysrhythmia, goiter, insomnia, GERD, and SOB.    PT Comments    Pt seen this pm while supine in bed due to receiving IV at right foot access. Pt issued B LE strengthening exercises to be completed in supine at this time due to continued dizziness with positional changes. Pt able to demonstrate good understanding and agreed to be compliant with HEP. Continue per POC while monitoring BP.  Follow Up Recommendations  SNF;Other (comment)     Equipment Recommendations  Rolling walker with 5" wheels    Recommendations for Other Services       Precautions / Restrictions Precautions Precautions: Fall Precaution Comments: dizziness with positional changes Restrictions Weight Bearing Restrictions: Yes LLE Weight Bearing: Weight bearing as tolerated    Mobility  Bed Mobility Overal bed mobility: Needs Assistance Bed Mobility: Supine to Sit;Sit to Supine     Supine to sit: Min assist;HOB elevated Sit to supine: Min assist   General bed mobility comments: assist for L LE management  Transfers Overall transfer level: Needs assistance Equipment used: Rolling walker (2 wheeled) Transfers: Sit to/from Stand Sit to Stand: Min assist         General transfer comment: generally unsteady with poor safety awareness, requires MIN A for STS from elevated bed.  Ambulation/Gait             General Gait Details: Unable to tolerate ambulation due to drop in BP   Stairs             Wheelchair Mobility    Modified Rankin (Stroke Patients Only)       Balance                                             Cognition Arousal/Alertness: Awake/alert Behavior During Therapy: WFL for tasks assessed/performed Overall Cognitive Status: Within Functional Limits for tasks assessed                                 General Comments: Orthostatic Hypotension after standing for ~20 seconds      Exercises      General Comments General comments (skin integrity, edema, etc.): Pt unable to progress functional mobility due to drop in BP and symptomatic.  Sitting BP 94/49  HR 89bpm, Standing after 20 seconds BP dropped to 68/50  HR 99bpm, nursing notified      Pertinent Vitals/Pain Pain Assessment: 0-10 Pain Score: 3  Pain Location: L Hip with mobility. Pain Descriptors / Indicators: Aching;Sore;Grimacing Pain Intervention(s): Premedicated before session    Home Living                      Prior Function            PT Goals (current goals can now be found in the care plan section) Acute Rehab PT Goals Patient Stated Goal: To go home/    Frequency  BID      PT Plan      Co-evaluation              AM-PAC PT "6 Clicks" Mobility   Outcome Measure  Help needed turning from your back to your side while in a flat bed without using bedrails?: A Little Help needed moving from lying on your back to sitting on the side of a flat bed without using bedrails?: A Little Help needed moving to and from a bed to a chair (including a wheelchair)?: A Little Help needed standing up from a chair using your arms (e.g., wheelchair or bedside chair)?: A Little Help needed to walk in hospital room?: A Little Help needed climbing 3-5 steps with a railing? : A Lot 6 Click Score: 17    End of Session Equipment Utilized During Treatment: Gait belt Activity Tolerance:  (Limited by drop in BP with positional changes) Patient left: in bed;with bed alarm set;with call bell/phone within reach Nurse Communication:  Mobility status;Other (comment) (BP/HR changes) PT Visit Diagnosis: Unsteadiness on feet (R26.81);Other abnormalities of gait and mobility (R26.89);Repeated falls (R29.6);Muscle weakness (generalized) (M62.81);History of falling (Z91.81);Difficulty in walking, not elsewhere classified (R26.2);Pain Pain - Right/Left: Left Pain - part of body: Hip     Time: 9702-6378 PT Time Calculation (min) (ACUTE ONLY): 45 min  Charges:  $Therapeutic Activity: 38-52 mins                     Mikel Cella, PTA   Josie Dixon 12/02/2020, 2:58 PM

## 2020-12-02 NOTE — TOC Progression Note (Signed)
Transition of Care [facility]) - Progression Note    Patient Details  Name: Katelyn Lee MRN: [id number] Date of Birth: 19-Dec-1936  Transition of Care [facility]) CM/SW Contact  Shelbie Ammons, RN Phone Number: 12/02/2020, 12:58 PM  Clinical Narrative:   11:45am- RNCM received call from Kathreen Cornfield with [place], she is a visiting Designer, jewellery. She reports that she has spoken with patient and patient is now agreeable to SNF. RNCM met with patient at bedside and she reports she now understands that she needs to go to SNF at least for short term. She does not have preference as to agency.  RNCM verified PASSR, completed FL-2 and started bed search.     Expected Discharge Plan: [place] Barriers to Discharge: No [place] will accept this patient  Expected Discharge Plan and Services Expected Discharge Plan: [place] arrangements for the past 2 months: Single Family Home                                       Social Determinants of Health (SDOH) Interventions    Readmission Risk Interventions No flowsheet data found.

## 2020-12-02 NOTE — Progress Notes (Signed)
Progress Note    MASSA PE  OEU:[id number] DOB: 1936/11/20  DOA: 11/27/2020 PCP: Sofie Hartigan, MD      Brief Narrative:    Medical records reviewed and are as summarized below:  Katelyn Lee is a 84 y.o. female difficult for paroxysmal atrial fibrillation on Xarelto, history of stroke, hyperlipidemia, who presented to the hospital after a fall at home (occurred while walking down a few steps).  She landed on her left hip and she complained of severe pain in the left hip.  She was admitted to the hospital for closed nondisplaced intertrochanteric fracture of the left femur.  She was treated with analgesics.  Orthopedic surgeon was consulted and she underwent intramedullary nailing of left femur with cephalomedullary device on 11/28/2020.  She also has vitamin D deficiency and will require vitamin D supplement.  She was evaluated by PT and OT who recommended further rehabilitation at the skilled nursing facility.      Assessment/Plan:   Principal Problem:   Closed nondisplaced intertrochanteric fracture of left femur, initial encounter ([place]) Active Problems:   History of stroke   Paroxysmal A-fib (HCC)   Mixed hyperlipidemia   Body mass index is 27.82 kg/m.   Left intertrochanteric hip fracture: s/p intramedullary nailing of left femur on 11/28/2020.  Continue analgesics, PT and OT.  Follow-up with orthopedic surgeon.    Acute blood loss anemia: H&H is stable.  No indication for blood transfusion at this time.  Monitor H&H.  Hypotension:/Orthostatic hypotension: Normal saline bolus 500 cc was ordered.  This will be followed by maintenance IV fluids  Paroxysmal atrial fibrillation and history of stroke: Continue Xarelto  Vitamin D deficiency: Vitamin D level is 21.57.  Continue vitamin D supplement  Hyperlipidemia: Continue rosuvastatin  Anxiety: Continue sertraline  Initially, patient wanted to go home with home health therapy.  However, she changed her  mind and now prefers to go to SNF.   Diet Order            Diet regular Room service appropriate? Yes; Fluid consistency: Thin  Diet effective now                    Consultants:  Orthopedic surgeon, Dr. Leim Fabry  Procedures:  Left hip surgery on 11/28/2020    Medications:   . acetaminophen  1,000 mg Oral Q8H  . cholecalciferol  1,000 Units Oral Daily  . docusate sodium  100 mg Oral BID  . ferrous [id number] C-folic acid  1 capsule Oral BID  . polyethylene glycol  17 g Oral Daily  . rivaroxaban  20 mg Oral QPC breakfast  . rosuvastatin  5 mg Oral QPC breakfast  . sertraline  150 mg Oral QPC breakfast   Continuous Infusions: . methocarbamol (ROBAXIN) IV       Anti-infectives (From admission, onward)   Start     Dose/Rate Route Frequency Ordered Stop   11/28/20 2000  ceFAZolin (ANCEF) IVPB 2g/100 mL premix        2 g 200 mL/hr over 30 Minutes Intravenous Every 6 hours 11/28/20 1807 11/29/20 0924   11/28/20 0600  ceFAZolin (ANCEF) IVPB 2g/100 mL premix        2 g 200 mL/hr over 30 Minutes Intravenous On call to O.R. 11/27/20 2252 11/28/20 1536             Family Communication/Anticipated D/C date and plan/Code Status   DVT prophylaxis: SCDs Start: 11/28/20 1807 rivaroxaban (XARELTO) tablet  20 mg     Code Status: DNR  Family Communication: None Disposition Plan:    Status is: Inpatient  Remains inpatient appropriate because:Unsafe d/c plan and Inpatient level of care appropriate due to severity of illness   Dispo: The patient is from: Home              Anticipated d/c is to: SNF              Anticipated d/c date is: 1 day              Patient currently is not medically stable to d/c.           Subjective:   C/o left hip pain.  Objective:    Vitals:   12/02/20 0356 12/02/20 0756 12/02/20 1100 12/02/20 1219  BP: 117/62 110/76 (!) 94/49 113/63  Pulse: 72 70 89 72  Resp: 15 16  16   Temp: 97.6 F (36.4 C) 98.1  F (36.7 C)  98.1 F (36.7 C)  TempSrc:      SpO2: 96% 96%  96%  Weight:      Height:        Orthostatic hypotension: BP sitting was 94/49 BP standing was 68/50.   No data found.   Intake/Output Summary (Last 24 hours) at 12/02/2020 1406 Last data filed at 12/02/2020 0544 Gross per 24 hour  Intake 120 ml  Output 350 ml  Net -230 ml   Filed Weights   11/27/20 1745  Weight: 83 kg    Exam:  GEN: NAD SKIN: Warm and dry EYES: No pallor or icterus ENT: MMM CV: RRR PULM: CTA B ABD: soft, ND, NT, +BS CNS: AAO x 3, non focal EXT: Left hip surgical swelling and tenderness.  Dressing on left hip wound surgical wound is intact.     Data Reviewed:   I have personally reviewed following labs and imaging studies:  Labs: Labs show the following:   Basic Metabolic Panel: Recent Labs  Lab 11/27/20 1813 11/28/20 0513 11/29/20 0427 11/30/20 0519 12/01/20 0452  NA 136 139 138 136 136  K 4.8 4.5 4.5 4.1 4.0  CL 104 105 106 105 104  CO2 24 25 23 25 24   GLUCOSE 98 94 145* 99 105*  BUN 21 18 23 20 21   CREATININE 1.00 0.87 1.07* 0.98 0.88  CALCIUM 8.8* 8.9 8.7* 8.4* 8.4*   GFR Estimated Creatinine Clearance: 55.6 mL/min (by C-G formula based on SCr of 0.88 mg/dL). Liver Function Tests: Recent Labs  Lab 11/27/20 1813  AST 28  ALT 19  ALKPHOS 75  BILITOT 0.7  PROT 6.8  ALBUMIN 3.6   No results for input(s): LIPASE, AMYLASE in the last 168 hours. No results for input(s): AMMONIA in the last 168 hours. Coagulation profile Recent Labs  Lab 11/27/20 1813  INR 2.7*    CBC: Recent Labs  Lab 11/27/20 1813 11/28/20 0513 11/29/20 0427 11/30/20 0519 12/01/20 0452 12/02/20 0419  WBC 7.2 4.7 6.5 7.4 6.8 6.3  NEUTROABS 4.8  --   --   --   --  3.9  HGB 11.6* 11.0* 9.5* 8.8* 8.3* 8.7*  HCT 36.6 33.7* 28.8* 27.3* 25.2* 26.2*  MCV 93.1 90.8 89.7 90.4 90.0 89.4  PLT 222 229 207 202 199 220   Cardiac Enzymes: No results for input(s): CKTOTAL, CKMB, CKMBINDEX,  TROPONINI in the last 168 hours. BNP (last 3 results) No results for input(s): PROBNP in the last 8760 hours. CBG: Recent Labs  Lab 11/29/20  [place]   D-Dimer: No results for input(s): DDIMER in the last 72 hours. Hgb A1c: No results for input(s): HGBA1C in the last 72 hours. Lipid Profile: No results for input(s): CHOL, HDL, LDLCALC, TRIG, CHOLHDL, LDLDIRECT in the last 72 hours. Thyroid function studies: No results for input(s): TSH, T4TOTAL, T3FREE, THYROIDAB in the last 72 hours.  Invalid input(s): FREET3 Anemia work up: No results for input(s): VITAMINB12, FOLATE, FERRITIN, TIBC, IRON, RETICCTPCT in the last 72 hours. Sepsis Labs: Recent Labs  Lab 11/29/20 0427 11/30/20 0519 12/01/20 0452 12/02/20 0419  WBC 6.5 7.4 6.8 6.3    Microbiology Recent Results (from the past 240 hour(s))  Resp Panel by RT-PCR (Flu A&B, Covid) Nasopharyngeal Swab     Status: None   Collection Time: 11/27/20  6:13 PM   Specimen: Nasopharyngeal Swab; Nasopharyngeal(NP) swabs in vial transport medium  Result Value Ref Range Status   SARS Coronavirus 2 by RT PCR NEGATIVE NEGATIVE Final    Comment: (NOTE) SARS-CoV-2 target nucleic acids are NOT DETECTED.  The SARS-CoV-2 RNA is generally detectable in upper respiratory specimens during the acute phase of infection. The lowest concentration of SARS-CoV-2 viral copies this assay can detect is 138 copies/mL. A negative result does not preclude SARS-Cov-2 infection and should not be used as the sole basis for treatment or other patient management decisions. A negative result may occur with  improper specimen collection/handling, submission of specimen other than nasopharyngeal swab, presence of viral mutation(s) within the areas targeted by this assay, and inadequate number of viral copies(<138 copies/mL). A negative result must be combined with clinical observations, patient history, and epidemiological information. The expected result  is Negative.  Fact Sheet for Patients:  EntrepreneurPulse.com.au  Fact Sheet for Healthcare Providers:  IncredibleEmployment.be  This test is no t yet approved or cleared by the [place] FDA and  has been authorized for detection and/or diagnosis of SARS-CoV-2 by FDA under an Emergency Use Authorization (EUA). This EUA will remain  in effect (meaning this test can be used) for the duration of the COVID-19 declaration under Section 564(b)(1) of the Act, 21 U.S.C.section 360bbb-3(b)(1), unless the authorization is terminated  or revoked sooner.       Influenza A by PCR NEGATIVE NEGATIVE Final   Influenza B by PCR NEGATIVE NEGATIVE Final    Comment: (NOTE) The Xpert Xpress SARS-CoV-2/FLU/RSV plus assay is intended as an aid in the diagnosis of influenza from Nasopharyngeal swab specimens and should not be used as a sole basis for treatment. Nasal washings and aspirates are unacceptable for Xpert Xpress SARS-CoV-2/FLU/RSV testing.  Fact Sheet for Patients: EntrepreneurPulse.com.au  Fact Sheet for Healthcare Providers: IncredibleEmployment.be  This test is not yet approved or cleared by the [place] FDA and has been authorized for detection and/or diagnosis of SARS-CoV-2 by FDA under an Emergency Use Authorization (EUA). This EUA will remain in effect (meaning this test can be used) for the duration of the COVID-19 declaration under Section 564(b)(1) of the Act, 21 U.S.C. section 360bbb-3(b)(1), unless the authorization is terminated or revoked.  Performed at [facility], [address] [place], [place] [address]     Procedures and diagnostic studies:  No results found.             LOS: 5 days   Taylorann Tkach  [facility]   Pager on [email or web address]. If 7PM-7AM, please contact night-coverage at [email or web address]     12/02/2020, 2:06 PM

## 2020-12-02 NOTE — NC FL2 (Signed)
[place] LEVEL OF CARE SCREENING TOOL     IDENTIFICATION  Patient Name: Katelyn Lee Birthdate: 06-02-1936 Sex: female Admission Date (Current Location): 11/27/2020  [place] and [place] Number:  Engineering geologist and Address:  [facility], [address] [place], [place] [address]      Provider Number: [id number]  Attending Physician Name and Address:  Jennye Boroughs, MD  Relative Name and Phone Number:  Haywood Pao [phone]    Current Level of Care: Hospital Recommended Level of Care: [place] Prior Approval Number:    Date Approved/Denied:   PASRR Number: [id number]  Discharge Plan: SNF    Current Diagnoses: Patient Active Problem List   Diagnosis Date Noted  . Closed nondisplaced intertrochanteric fracture of left femur, initial encounter ([place]) 11/27/2020  . History of stroke 08/24/2017  . Paroxysmal A-fib ([place]) 03/28/2015  . Mixed hyperlipidemia 03/28/2015    Orientation RESPIRATION BLADDER Height & Weight     Self,Time,Situation,Place  Normal External catheter Weight: 83 kg Height:  5\' 8"  (172.7 cm)  BEHAVIORAL SYMPTOMS/MOOD NEUROLOGICAL BOWEL NUTRITION STATUS      Continent Diet (Regular)  AMBULATORY STATUS COMMUNICATION OF NEEDS Skin   Extensive Assist Verbally Surgical wounds                       Personal Care Assistance Level of Assistance  Bathing,Feeding,Dressing Bathing Assistance: Maximum assistance Feeding assistance: Limited assistance Dressing Assistance: Maximum assistance     Functional Limitations Info             SPECIAL CARE FACTORS FREQUENCY  PT (By licensed PT),OT (By licensed OT)                    Contractures Contractures Info: Not present    Additional Factors Info  Code Status,Allergies Code Status Info: DNR Allergies Info: Fludrocortisone, Coumadin           Current Medications (12/02/2020):  This is the current hospital active  medication list Current Facility-Administered Medications  Medication Dose Route Frequency Provider Last Rate Last Admin  . acetaminophen (TYLENOL) tablet 1,000 mg  1,000 mg Oral Q8H Leim Fabry, MD   1,000 mg at 12/02/20 0950  . bisacodyl (DULCOLAX) suppository 10 mg  10 mg Rectal Daily PRN Leim Fabry, MD   10 mg at 12/01/20 1313  . cholecalciferol (VITAMIN D3) tablet 1,000 Units  1,000 Units Oral Daily Leim Fabry, MD   1,000 Units at 12/02/20 [phone]  . docusate sodium (COLACE) capsule 100 mg  100 mg Oral BID Leim Fabry, MD   100 mg at 12/02/20 0951  . ferrous [id number] C-folic acid (TRINSICON / FOLTRIN) capsule 1 capsule  1 capsule Oral BID Duanne Guess, PA-C   1 capsule at 12/02/20 0951  . HYDROmorphone (DILAUDID) injection 0.25-0.5 mg  0.25-0.5 mg Intravenous Q2H PRN Leim Fabry, MD      . methocarbamol (ROBAXIN) tablet 500 mg  500 mg Oral Q6H PRN Leim Fabry, MD   500 mg at 11/29/20 2110   Or  . methocarbamol (ROBAXIN) 500 mg in dextrose 5 % 50 mL IVPB  500 mg Intravenous Q6H PRN Leim Fabry, MD      . metoCLOPramide (REGLAN) tablet 5-10 mg  5-10 mg Oral Q8H PRN Leim Fabry, MD       Or  . metoCLOPramide (REGLAN) injection 5-10 mg  5-10 mg Intravenous Q8H PRN Leim Fabry, MD      . ondansetron (  ZOFRAN) tablet 4 mg  4 mg Oral Q6H PRN Leim Fabry, MD       Or  . ondansetron [facility]) injection 4 mg  4 mg Intravenous Q6H PRN Leim Fabry, MD      . oxyCODONE (Oxy IR/ROXICODONE) immediate release tablet 2.5-5 mg  2.5-5 mg Oral Q3H PRN Leim Fabry, MD   5 mg at 11/29/20 0416  . oxyCODONE (Oxy IR/ROXICODONE) immediate release tablet 5-10 mg  5-10 mg Oral Q4H PRN Leim Fabry, MD   5 mg at 11/30/20 1733  . polyethylene glycol (MIRALAX / GLYCOLAX) packet 17 g  17 g Oral Daily Jennye Boroughs, MD   17 g at 12/02/20 1200  . rivaroxaban (XARELTO) tablet 20 mg  20 mg Oral QPC breakfast Leim Fabry, MD   20 mg at 12/02/20 0951  . rosuvastatin (CRESTOR) tablet 5 mg  5 mg Oral  QPC breakfast Leim Fabry, MD   5 mg at 12/02/20 0951  . senna-docusate (Senokot-S) tablet 1 tablet  1 tablet Oral QHS PRN Leim Fabry, MD      . sertraline (ZOLOFT) tablet 150 mg  150 mg Oral QPC breakfast Leim Fabry, MD   150 mg at 12/02/20 0951  . sodium phosphate (FLEET) 7-19 GM/118ML enema 1 enema  1 enema Rectal Once PRN Leim Fabry, MD      . traMADol Veatrice Bourbon) tablet 50 mg  50 mg Oral Q6H PRN Leim Fabry, MD   50 mg at 11/28/20 1840     Discharge Medications: Please see discharge summary for a list of discharge medications.  Relevant Imaging Results:  Relevant Lab Results:   Additional Information SS# [id number]  Shelbie Ammons, RN

## 2020-12-03 LAB — RESP PANEL BY RT-PCR (FLU A&B, COVID) ARPGX2
Influenza A by PCR: NEGATIVE
Influenza B by PCR: NEGATIVE
SARS Coronavirus 2 by RT PCR: NEGATIVE

## 2020-12-03 MED ORDER — VITAMIN D3 25 MCG PO TABS: 1000.0000 [IU] | ORAL_TABLET | Freq: Every day | ORAL | Status: AC

## 2020-12-03 MED ORDER — VITAMIN D (ERGOCALCIFEROL) 1.25 MG (50000 UNIT) PO CAPS: 50000.0000 [IU] | ORAL_CAPSULE | ORAL | 0 refills | Status: AC

## 2020-12-03 MED ORDER — POLYETHYLENE GLYCOL 3350 17 G PO PACK: 17.0000 g | PACK | Freq: Every day | ORAL | 0 refills | Status: AC

## 2020-12-03 MED ORDER — FE FUMARATE-B12-VIT C-FA-IFC PO CAPS: 1.0000 | ORAL_CAPSULE | Freq: Two times a day (BID) | ORAL | Status: AC

## 2020-12-03 MED ORDER — DOCUSATE SODIUM 100 MG PO CAPS
100.0000 mg | ORAL_CAPSULE | Freq: Two times a day (BID) | ORAL | 0 refills | Status: AC
Start: 2020-12-03 — End: ?

## 2020-12-03 NOTE — Progress Notes (Signed)
Report called to [facility] 

## 2020-12-03 NOTE — TOC Progression Note (Signed)
Transition of Care [facility]) - Progression Note    Patient Details  Name: GEORGETTE HELMER MRN: [id number] Date of Birth: [birth year]  Transition of Care [facility]) CM/SW [place], RN Phone Number: 12/03/2020, 10:46 AM  Clinical Narrative:   RNCM received insurance authorization through [employer] portal for patient. Authorization is good 12/15 with next review date of 12/17. Plan auth ID is [id number] and reference number of [id number]. Facility notified and EMS paper completed in preparation for discharge.     Expected Discharge Plan: [place] Barriers to Discharge: No [facility] will accept this patient  Expected Discharge Plan and Services Expected Discharge Plan: [place] arrangements for the past 2 months: Single Family Home Expected Discharge Date: 12/03/20                                     Social Determinants of Health (SDOH) Interventions    Readmission Risk Interventions No flowsheet data found.

## 2020-12-03 NOTE — TOC Progression Note (Signed)
Transition of Care [facility]) - Progression Note    Patient Details  Name: Katelyn Lee MRN: [id number] Date of Birth: 09-19-1936  Transition of Care [facility]) CM/SW Contact  Shelbie Ammons, RN Phone Number: 12/03/2020, 8:29 AM  Clinical Narrative:   RNCM met with patient at bedside to discuss bed options. Patient informed that she has bed offers from [facility], [facility], [facility] and [facility]. Patient reports that she does not want to go to [facility]. Reviewed Medicare.gov ratings of facilities. Patient reports that she would like to accept bed at [facility] due to it being the closest to her home.  RNCM accepted bed in hub, notified facility and started insurance authorization through [place].     Expected Discharge Plan: [place] Barriers to Discharge: No [place] will accept this patient  Expected Discharge Plan and Services Expected Discharge Plan: [place] arrangements for the past 2 months: Single Family Home                                       Social Determinants of Health (SDOH) Interventions    Readmission Risk Interventions No flowsheet data found.

## 2020-12-03 NOTE — Discharge Summary (Addendum)
Physician Discharge Summary  DEAISA MERIDA WGY:[id number] DOB: [birth year] DOA: 11/27/2020  PCP: Katelyn Hartigan, MD  Admit date: 11/27/2020 Discharge date: 12/03/2020  Admitted From: Home Disposition:  SNF  Discharge Condition:Stable CODE STATUS: DNR Diet recommendation:Regular  Brief/Interim Summary: Katelyn Lee is a 84 y.o. female difficult for paroxysmal atrial fibrillation on Xarelto, history of stroke, hyperlipidemia, who presented to the hospital after a fall at home (occurred while walking down a few steps).  She landed on her left hip and she complained of severe pain in the left hip. She was admitted to the hospital for closed nondisplaced intertrochanteric fracture of the left femur.  She was treated with analgesics.  Orthopedic surgeon was consulted and she underwent intramedullary nailing of left femur with cephalomedullary device on 11/28/2020.  She also has vitamin D deficiency and will require vitamin D supplement.  She was evaluated by PT and OT who recommended further rehabilitation at the skilled nursing facility.  She is medically stable  for discharge.  Following problems were addressed during her hospitalization:  Left intertrochanteric hip fracture: s/p intramedullary nailing of left femur on 11/28/2020.  Continue analgesics, PT and OT.  Follow-up with orthopedic surgeon.    Acute blood loss anemia: H&H is stable.  No indication for blood transfusion at this time.Katelyn Lee in a week.  Hypotension: Currently BP stable  Paroxysmal atrial fibrillation and history of stroke: Continue Xarelto  Vitamin D deficiency: Vitamin D level is 21.57.  Continue vitamin D supplement: 50,000 unit every week for 5 weeks.  Continue 1000 units daily indefinately  Hyperlipidemia: Continue rosuvastatin  Anxiety: Continue sertraline   Discharge Diagnoses:  Principal Problem:   Closed nondisplaced intertrochanteric fracture of left femur, initial encounter (Katelyn Lee) Active  Problems:   History of stroke   Paroxysmal A-fib ([place])   Mixed hyperlipidemia    Discharge Instructions  Discharge Instructions    Diet general   Complete by: As directed    Discharge instructions   Complete by: As directed    1)Take prescribed medications as instructed 2)Follow up with orthopedics in 2 weeks.Name and number of the provider has been attached 3)Do a Lee test to check your Hb in a  week   Increase activity slowly   Complete by: As directed    No wound care   Complete by: As directed      Allergies as of 12/03/2020      Reactions   Fludrocortisone Other (See Comments)   Pt had a spike in her Blood pressure   Coumadin [warfarin Sodium] Rash      Medication List    STOP taking these medications   celecoxib 100 MG capsule Commonly known as: CELEBREX     TAKE these medications   docusate sodium 100 MG capsule Commonly known as: COLACE Take 1 capsule (100 mg total) by mouth 2 (two) times daily.   ferrous [id number] C-folic acid capsule Commonly known as: TRINSICON / FOLTRIN Take 1 capsule by mouth 2 (two) times daily.   oxyCODONE 5 MG immediate release tablet Commonly known as: Oxy IR/ROXICODONE Take 0.5-1 tablets (2.5-5 mg total) by mouth every 6 (six) hours as needed for moderate pain or severe pain (pain score 4-6).   polyethylene glycol 17 g packet Commonly known as: MIRALAX / GLYCOLAX Take 17 g by mouth daily.   rivaroxaban 20 MG Tabs tablet Commonly known as: XARELTO Take 20 mg by mouth daily after breakfast.   rosuvastatin 5 MG tablet Commonly known as: CRESTOR Take  5 mg by mouth daily after breakfast.   sertraline 100 MG tablet Commonly known as: ZOLOFT Take 150 mg by mouth daily after breakfast.   traMADol 50 MG tablet Commonly known as: ULTRAM Take 1 tablet (50 mg total) by mouth every 6 (six) hours as needed for moderate pain. What changed:   when to take this  reasons to take this   Vitamin D3 25 MCG  tablet Commonly known as: Vitamin D Take 1 tablet (1,000 Units total) by mouth daily.       Contact information for follow-up providers    Reche Dixon, PA-C Follow up in 2 week(s).   Specialty: Orthopedic Surgery Why: For staple removal and x-rays of the left femur Contact information: [address] [place] [address] [phone]            Contact information for after-discharge care    Destination    [facility] Preferred SNF .   Service: Skilled Nursing Contact information: [place] [phone]                 Allergies  Allergen Reactions  . Fludrocortisone Other (See Comments)    Pt had a spike in her Blood pressure    . Coumadin [Warfarin Sodium] Rash    Consultations:  Orthopedics   Procedures/Studies: MR HIP LEFT WO CONTRAST  Result Date: 11/27/2020 CLINICAL DATA:  Left hip pain after fall 3 days ago EXAM: MR OF THE LEFT HIP WITHOUT CONTRAST TECHNIQUE: Multiplanar, multisequence MR imaging was performed. No intravenous contrast was administered. COMPARISON:  X-ray 11/24/2020 FINDINGS: Bones/Joint/Cartilage Acute nondisplaced intertrochanteric fracture of the proximal left femur with extensive surrounding bone marrow edema (series 4, image 19). No angulation. No fracture involvement of the femoral head. There is periosteal edema surrounding the subtrochanteric aspect of the [facility] ir without evidence of fracture extension distally. No additional fractures. No dislocation. No femoral head avascular necrosis. Mild degenerative changes of the bilateral hip joints. Small left hip joint effusion, likely reactive. Lower lumbar degenerative disc disease. Ligaments Intact. Muscles and Tendons Partial tear of the left hamstring tendon origin (series 3, image 16). Partial-thickness insertional tear of the left gluteus minimus tendon (series 3, image 24). The remaining tendinous  structures are within normal limits. There is intramuscular edema surrounding the proximal left hip and thigh, likely reactive/posttraumatic. Soft tissues No soft tissue fluid collection or hematoma. No inguinal lymphadenopathy. Sigmoid diverticulosis. No acute findings within the visualized pelvis. IMPRESSION: 1. Acute nondisplaced intertrochanteric fracture of the proximal left femur with extensive surrounding bone marrow edema. 2. Partial tear of the left hamstring tendon origin and left gluteus minimus tendon insertion. These results will be called to the ordering clinician or representative by the Radiologist Assistant, and communication documented in the PACS or [employer]. Electronically Signed   By: Davina Poke D.O.   On: 11/27/2020 15:49   DG Chest Portable 1 View  Result Date: 11/27/2020 CLINICAL DATA:  Preop hip fracture EXAM: PORTABLE CHEST 1 VIEW COMPARISON:  None. FINDINGS: The heart size and mediastinal contours are within normal limits. Aortic knob calcifications are seen. Both lungs are clear. The visualized skeletal structures are unremarkable. IMPRESSION: No active disease. Electronically Signed   By: Prudencio Pair M.D.   On: 11/27/2020 19:21   DG HIP OPERATIVE UNILAT W OR W/O PELVIS LEFT  Result Date: 11/28/2020 CLINICAL DATA:  Imaging for ORIF of an intertrochanteric left proximal femur fracture. EXAM: OPERATIVE  LEFT HIP (WITH PELVIS IF PERFORMED) 4 VIEWS TECHNIQUE: Fluoroscopic spot image(s) were submitted for interpretation post-operatively. COMPARISON:  MRI, 11/27/2020 FINDINGS: Short intramedullary rod extends from the greater trochanter to the proximal diaphysis. Rod supports 2 compression screws extending to the femoral head. The intertrochanteric fracture is not defined. There is no evidence of displacement or angulation. Orthopedic hardware is well-seated. No evidence of a fracture complication. IMPRESSION: 1. Well aligned proximal left femur fracture following ORIF.  Electronically Signed   By: Lajean Manes M.D.   On: 11/28/2020 16:49   DG Hip Unilat With Pelvis 2-3 Views Left  Result Date: 11/24/2020 CLINICAL DATA:  Left hip pain after fall last week. EXAM: DG HIP (WITH OR WITHOUT PELVIS) 2-3V LEFT COMPARISON:  None. FINDINGS: There is no evidence of hip fracture or dislocation. Mild osteophyte formation is seen involving the left hip. IMPRESSION: Mild osteoarthritis of the left hip.  No acute abnormality is noted. Electronically Signed   By: Marijo Conception M.D.   On: 11/24/2020 13:15       Subjective: Patient seen and examined at the bedside this morning.  Hemodynamically stable for discharge.  Discharge Exam: Vitals:   12/03/20 0514 12/03/20 0752  BP: 136/71 134/78  Pulse: 69 67  Resp:  19  Temp: 97.8 F (36.6 C) 98.1 F (36.7 C)  SpO2: 97% 96%   Vitals:   12/02/20 2004 12/02/20 2312 12/03/20 0514 12/03/20 0752  BP: 135/72 (!) 142/77 136/71 134/78  Pulse: 70 72 69 67  Resp: 18 17  19   Temp: 98.6 F (37 C) 98.1 F (36.7 C) 97.8 F (36.6 C) 98.1 F (36.7 C)  TempSrc: Oral Oral Oral   SpO2: 98% 97% 97% 96%  Weight:      Height:        General: Pt is alert, awake, not in acute distress Cardiovascular: RRR, S1/S2 +, no rubs, no gallops Respiratory: CTA bilaterally, no wheezing, no rhonchi Abdominal: Soft, NT, ND, bowel sounds + Extremities: no edema, no cyanosis, clean surgical incision on the left hip   The results of significant diagnostics from this hospitalization (including imaging, microbiology, ancillary and laboratory) are listed below for reference.     Microbiology: Recent Results (from the past 240 hour(s))  Resp Panel by RT-PCR (Flu A&B, Covid) Nasopharyngeal Swab     Status: None   Collection Time: 11/27/20  6:13 PM   Specimen: Nasopharyngeal Swab; Nasopharyngeal(NP) swabs in vial transport medium  Result Value Ref Range Status   SARS Coronavirus 2 by RT PCR NEGATIVE NEGATIVE Final    Comment:  (NOTE) SARS-CoV-2 target nucleic acids are NOT DETECTED.  The SARS-CoV-2 RNA is generally detectable in upper respiratory specimens during the acute phase of infection. The lowest concentration of SARS-CoV-2 viral copies this assay can detect is 138 copies/mL. A negative result does not preclude SARS-Cov-2 infection and should not be used as the sole basis for treatment or other patient management decisions. A negative result may occur with  improper specimen collection/handling, submission of specimen other than nasopharyngeal swab, presence of viral mutation(s) within the areas targeted by this assay, and inadequate number of viral copies(<138 copies/mL). A negative result must be combined with clinical observations, patient history, and epidemiological information. The expected result is Negative.  Fact Sheet for Patients:  EntrepreneurPulse.com.au  Fact Sheet for Healthcare Providers:  IncredibleEmployment.be  This test is no t yet approved or cleared by the [place] FDA and  has been authorized for detection and/or diagnosis of  SARS-CoV-2 by FDA under an Emergency Use Authorization (EUA). This EUA will remain  in effect (meaning this test can be used) for the duration of the COVID-19 declaration under Section 564(b)(1) of the Act, 21 U.S.C.section 360bbb-3(b)(1), unless the authorization is terminated  or revoked sooner.       Influenza A by PCR NEGATIVE NEGATIVE Final   Influenza B by PCR NEGATIVE NEGATIVE Final    Comment: (NOTE) The Xpert Xpress SARS-CoV-2/FLU/RSV plus assay is intended as an aid in the diagnosis of influenza from Nasopharyngeal swab specimens and should not be used as a sole basis for treatment. Nasal washings and aspirates are unacceptable for Xpert Xpress SARS-CoV-2/FLU/RSV testing.  Fact Sheet for Patients: EntrepreneurPulse.com.au  Fact Sheet for Healthcare  Providers: IncredibleEmployment.be  This test is not yet approved or cleared by the [place] FDA and has been authorized for detection and/or diagnosis of SARS-CoV-2 by FDA under an Emergency Use Authorization (EUA). This EUA will remain in effect (meaning this test can be used) for the duration of the COVID-19 declaration under Section 564(b)(1) of the Act, 21 U.S.C. section 360bbb-3(b)(1), unless the authorization is terminated or revoked.  Performed at [facility], [address] [place], [place] [address]      Labs: BNP (last 3 results) No results for input(s): BNP in the last 8760 hours. Basic Metabolic Panel: Recent Labs  Lab 11/27/20 1813 11/28/20 0513 11/29/20 0427 11/30/20 0519 12/01/20 0452  NA 136 139 138 136 136  K 4.8 4.5 4.5 4.1 4.0  CL 104 105 106 105 104  CO2 24 25 23 25 24   GLUCOSE 98 94 145* 99 105*  BUN 21 18 23 20 21   CREATININE 1.00 0.87 1.07* 0.98 0.88  CALCIUM 8.8* 8.9 8.7* 8.4* 8.4*   Liver Function Tests: Recent Labs  Lab 11/27/20 1813  AST 28  ALT 19  ALKPHOS 75  BILITOT 0.7  PROT 6.8  ALBUMIN 3.6   No results for input(s): LIPASE, AMYLASE in the last 168 hours. No results for input(s): AMMONIA in the last 168 hours. Lee: Recent Labs  Lab 11/27/20 1813 11/28/20 0513 11/29/20 0427 11/30/20 0519 12/01/20 0452 12/02/20 0419  WBC 7.2 4.7 6.5 7.4 6.8 6.3  NEUTROABS 4.8  --   --   --   --  3.9  HGB 11.6* 11.0* 9.5* 8.8* 8.3* 8.7*  HCT 36.6 33.7* 28.8* 27.3* 25.2* 26.2*  MCV 93.1 90.8 89.7 90.4 90.0 89.4  PLT 222 229 207 202 199 220   Cardiac Enzymes: No results for input(s): CKTOTAL, CKMB, CKMBINDEX, TROPONINI in the last 168 hours. BNP: Invalid input(s): POCBNP CBG: Recent Labs  Lab 11/29/20 1211  GLUCAP 94   D-Dimer No results for input(s): DDIMER in the last 72 hours. Hgb A1c No results for input(s): HGBA1C in the last 72 hours. Lipid Profile No results for input(s): CHOL, HDL,  LDLCALC, TRIG, CHOLHDL, LDLDIRECT in the last 72 hours. Thyroid function studies No results for input(s): TSH, T4TOTAL, T3FREE, THYROIDAB in the last 72 hours.  Invalid input(s): FREET3 Anemia work up No results for input(s): VITAMINB12, FOLATE, FERRITIN, TIBC, IRON, RETICCTPCT in the last 72 hours. Urinalysis    Component Value Date/Time   COLORURINE YELLOW (A) 03/23/2020 1617   APPEARANCEUR HAZY (A) 03/23/2020 1617   APPEARANCEUR Clear 10/03/2014 1025   LABSPEC 1.025 03/23/2020 1617   LABSPEC 1.017 10/03/2014 1025   PHURINE 5.0 03/23/2020 1617   GLUCOSEU NEGATIVE 03/23/2020 1617   GLUCOSEU Negative 10/03/2014 1025  HGBUR NEGATIVE 03/23/2020 [place] 03/23/2020 1617   BILIRUBINUR Negative 10/03/2014 1025   KETONESUR NEGATIVE 03/23/2020 1617   PROTEINUR NEGATIVE 03/23/2020 1617   NITRITE NEGATIVE 03/23/2020 1617   LEUKOCYTESUR MODERATE (A) 03/23/2020 1617   LEUKOCYTESUR Negative 10/03/2014 1025   Sepsis Labs Invalid input(s): PROCALCITONIN,  WBC,  LACTICIDVEN Microbiology Recent Results (from the past 240 hour(s))  Resp Panel by RT-PCR (Flu A&B, Covid) Nasopharyngeal Swab     Status: None   Collection Time: 11/27/20  6:13 PM   Specimen: Nasopharyngeal Swab; Nasopharyngeal(NP) swabs in vial transport medium  Result Value Ref Range Status   SARS Coronavirus 2 by RT PCR NEGATIVE NEGATIVE Final    Comment: (NOTE) SARS-CoV-2 target nucleic acids are NOT DETECTED.  The SARS-CoV-2 RNA is generally detectable in upper respiratory specimens during the acute phase of infection. The lowest concentration of SARS-CoV-2 viral copies this assay can detect is 138 copies/mL. A negative result does not preclude SARS-Cov-2 infection and should not be used as the sole basis for treatment or other patient management decisions. A negative result may occur with  improper specimen collection/handling, submission of specimen other than nasopharyngeal swab, presence of viral  mutation(s) within the areas targeted by this assay, and inadequate number of viral copies(<138 copies/mL). A negative result must be combined with clinical observations, patient history, and epidemiological information. The expected result is Negative.  Fact Sheet for Patients:  EntrepreneurPulse.com.au  Fact Sheet for Healthcare Providers:  IncredibleEmployment.be  This test is no t yet approved or cleared by the [place] FDA and  has been authorized for detection and/or diagnosis of SARS-CoV-2 by FDA under an Emergency Use Authorization (EUA). This EUA will remain  in effect (meaning this test can be used) for the duration of the COVID-19 declaration under Section 564(b)(1) of the Act, 21 U.S.C.section 360bbb-3(b)(1), unless the authorization is terminated  or revoked sooner.       Influenza A by PCR NEGATIVE NEGATIVE Final   Influenza B by PCR NEGATIVE NEGATIVE Final    Comment: (NOTE) The Xpert Xpress SARS-CoV-2/FLU/RSV plus assay is intended as an aid in the diagnosis of influenza from Nasopharyngeal swab specimens and should not be used as a sole basis for treatment. Nasal washings and aspirates are unacceptable for Xpert Xpress SARS-CoV-2/FLU/RSV testing.  Fact Sheet for Patients: EntrepreneurPulse.com.au  Fact Sheet for Healthcare Providers: IncredibleEmployment.be  This test is not yet approved or cleared by the [place] FDA and has been authorized for detection and/or diagnosis of SARS-CoV-2 by FDA under an Emergency Use Authorization (EUA). This EUA will remain in effect (meaning this test can be used) for the duration of the COVID-19 declaration under Section 564(b)(1) of the Act, 21 U.S.C. section 360bbb-3(b)(1), unless the authorization is terminated or revoked.  Performed at [facility], [address], [place], [place] [address]     Please note: You were cared  for by a hospitalist during your hospital stay. Once you are discharged, your primary care physician will handle any further medical issues. Please note that NO REFILLS for any discharge medications will be authorized once you are discharged, as it is imperative that you return to your primary care physician (or establish a relationship with a primary care physician if you do not have one) for your post hospital discharge needs so that they can reassess your need for medications and monitor your lab values.    Time coordinating discharge: 40 minutes  SIGNED:   Shelly Coss, MD  [facility] 12/03/2020, 10:40 AM Pager [phone]  If 7PM-7AM, please contact night-coverage [email or web address] Password [email or web address]

## 2020-12-03 NOTE — Progress Notes (Signed)
Physical Therapy Treatment Patient Details Name: Katelyn Lee MRN: [id number] DOB: 21-Dec-1935 Today's Date: 12/03/2020    History of Present Illness Pt. is an 84 y.o. female who was admitted to [facility] for IM nailing repair of a left closed nondisplaced intertrochanteric Femur Fx. Pt. sustained the Femur fx during a fall down the outside steps 10 days prior to being admitted for surgery.  PMH includes: A-fib, dyspnea, dysrhythmia, goiter, insomnia, GERD, and SOB.    PT Comments    Patient received in bed, agreeable to PT session. NT in room and reports patient tried to get up to [facility] and was unable, therefore used bedpan. Patient is mod independent with bed mobility, transfers with min guard. Ambulated 20 feet with RW and min guard. Much improved mobility from prior session. Patient will continue to benefit from skilled PT while here to improve functional independence and safety for eventual return home.    Follow Up Recommendations  SNF     Equipment Recommendations  Rolling walker with 5" wheels    Recommendations for Other Services       Precautions / Restrictions Precautions Precautions: Fall Restrictions Weight Bearing Restrictions: Yes LLE Weight Bearing: Weight bearing as tolerated    Mobility  Bed Mobility Overal bed mobility: Modified Independent Bed Mobility: Supine to Sit     Supine to sit: Modified independent (Device/Increase time);HOB elevated        Transfers Overall transfer level: Needs assistance Equipment used: Rolling walker (2 wheeled) Transfers: Sit to/from Stand Sit to Stand: Min guard            Ambulation/Gait Ambulation/Gait assistance: Min guard Gait Distance (Feet): 20 Feet Assistive device: Rolling walker (2 wheeled) Gait Pattern/deviations: Step-through pattern;Decreased step length - right;Decreased step length - left;Narrow base of support Gait velocity: decreased   General Gait Details: Patient with improved mobility this  morning. Distance limited by weakness   Stairs             Wheelchair Mobility    Modified Rankin (Stroke Patients Only)       Balance Overall balance assessment: Needs assistance Sitting-balance support: Feet supported Sitting balance-Leahy Scale: Good     Standing balance support: Bilateral upper extremity supported;During functional activity Standing balance-Leahy Scale: Fair Standing balance comment: reliant on RW, min guard for safety                            Cognition Arousal/Alertness: Awake/alert Behavior During Therapy: WFL for tasks assessed/performed Overall Cognitive Status: Within Functional Limits for tasks assessed                                        Exercises Total Joint Exercises Ankle Circles/Pumps: AROM;10 reps;Both Heel Slides: AROM;Left;10 reps Hip ABduction/ADduction: AROM;Left;10 reps    General Comments        Pertinent Vitals/Pain Pain Assessment: Faces Faces Pain Scale: Hurts a little bit Pain Location: L Hip with mobility. Pain Descriptors / Indicators: Aching;Sore;Discomfort Pain Intervention(s): Limited activity within patient's tolerance;Monitored during session;Repositioned    Home Living                      Prior Function            PT Goals (current goals can now be found in the care plan section) Acute Rehab PT Goals Patient Stated  Goal: To go home, be with her dog "pepper" PT Goal Formulation: With patient Time For Goal Achievement: 12/13/20 Potential to Achieve Goals: Good Progress towards PT goals: Progressing toward goals    Frequency    BID      PT Plan Current plan remains appropriate    Co-evaluation              AM-PAC PT "6 Clicks" Mobility   Outcome Measure  Help needed turning from your back to your side while in a flat bed without using bedrails?: None Help needed moving from lying on your back to sitting on the side of a flat bed without  using bedrails?: None Help needed moving to and from a bed to a chair (including a wheelchair)?: A Little Help needed standing up from a chair using your arms (e.g., wheelchair or bedside chair)?: A Little Help needed to walk in hospital room?: A Little Help needed climbing 3-5 steps with a railing? : A Lot 6 Click Score: 19    End of Session Equipment Utilized During Treatment: Gait belt Activity Tolerance: Patient tolerated treatment well;Patient limited by fatigue Patient left: in chair;with chair alarm set;with call bell/phone within reach Nurse Communication: Mobility status PT Visit Diagnosis: Difficulty in walking, not elsewhere classified (R26.2);Pain;Muscle weakness (generalized) (M62.81);History of falling (Z91.81) Pain - Right/Left: Left Pain - part of body: Hip     Time: 1000-1023 PT Time Calculation (min) (ACUTE ONLY): 23 min  Charges:  $Gait Training: 8-22 mins $Therapeutic Exercise: 8-22 mins                     Shayonna Ocampo, PT, GCS 12/03/20,10:30 AM

## 2020-12-03 NOTE — TOC Transition Note (Signed)
Transition of Care [facility]) - CM/SW Discharge Note   Patient Details  Name: Katelyn Lee MRN: [id number] Date of Birth: [birth year]  Transition of Care [facility]) CM/SW Contact:  Shelbie Ammons, RN Phone Number: 12/03/2020, 2:46 PM   Clinical Narrative:   RNCM received phone call from patient's DIL, Mrs. Katelyn Lee who reports they do not want [facility] and they would like [facility] instead. Discussed that at this point insurance authorization has been obtained and patient is just waiting to be ready for transport. Mrs. Katelyn Lee is adamant that they want patient to go to [facility] instead of [facility]. RNCM verified this information with patient, reached out to [facility] and informed them of same.RNCM placed call to [facility] with [facility] and they are still able to offer the bed and can take patient today.  RNCM placed call to [facility] and informed them of patient's change of decision.  RNCM returned call to Mrs. Coble to inform her patient will now be going to [facility].       Barriers to Discharge: No Home Care Agency will accept this patient   Patient Goals and CMS Choice        Discharge Placement                       Discharge Plan and Services                                     Social Determinants of Health (SDOH) Interventions     Readmission Risk Interventions No flowsheet data found.

## 2020-12-03 NOTE — Progress Notes (Signed)
   Subjective: 5 Days Post-Op Procedure(s) (LRB): INTRAMEDULLARY (IM) NAIL INTERTROCHANTRIC (Left) Patient reports pain as mild to moderate.  She feels as if pain is inhibiting her ability to walk or physical therapy.  She has been taking very little narcotic pain medications. Patient is well, and has had no acute complaints or problems Denies any CP, SOB, ABD pain. We will continue therapy today.   Objective: Vital signs in last 24 hours: Temp:  [97.5 F (36.4 C)-98.6 F (37 C)] 97.8 F (36.6 C) (12/15 0514) Pulse Rate:  [69-89] 69 (12/15 0514) Resp:  [16-18] 17 (12/14 2312) BP: (94-142)/(49-83) 136/71 (12/15 0514) SpO2:  [96 %-99 %] 97 % (12/15 0514)  Intake/Output from previous day: 12/14 0701 - 12/15 0700 In: 1099.7 [P.O.:580; IV Piggyback:519.7] Out: 600 [Urine:600] Intake/Output this shift: No intake/output data recorded.  Recent Labs    12/01/20 0452 12/02/20 0419  HGB 8.3* 8.7*   Recent Labs    12/01/20 0452 12/02/20 0419  WBC 6.8 6.3  RBC 2.80* 2.93*  HCT 25.2* 26.2*  PLT 199 220   Recent Labs    12/01/20 0452  NA 136  K 4.0  CL 104  CO2 24  BUN 21  CREATININE 0.88  GLUCOSE 105*  CALCIUM 8.4*   No results for input(s): LABPT, INR in the last 72 hours.  EXAM General - Patient is Alert, Appropriate and Oriented Extremity - Neurovascular intact Sensation intact distally Intact pulses distally Dorsiflexion/Plantar flexion intact No cellulitis present Compartment soft Dressing -distal incision site intact, dressing clean dry and intact.  Proximal incision site covered with foam tape.  Dressing is clean dry and intact.  No significant swelling throughout the thigh. Motor Function - intact, moving foot and toes well on exam.  Limited ambulation with physical therapy.  Past Medical History:  Diagnosis Date  . A-fib ([place])   . Arthritis    legs, lower back  . Dyspnea   . Dysrhythmia    A-FIB  . Goiter   . Insomnia   . Stroke ([place]) 07/29/2017   . Vertigo   . Wears dentures    full upper and lower  . Wears hearing aid in both ears     Assessment/Plan:   5 Days Post-Op Procedure(s) (LRB): INTRAMEDULLARY (IM) NAIL INTERTROCHANTRIC (Left) Principal Problem:   Closed nondisplaced intertrochanteric fracture of left femur, initial encounter (HCC) Active Problems:   History of stroke   Paroxysmal A-fib (HCC)   Mixed hyperlipidemia  Estimated body mass index is 27.82 kg/m as calculated from the following:   Height as of this encounter: 5\' 8"  (1.727 m).   Weight as of this encounter: 83 kg. Advance diet Up with therapy  Vital signs are stable Pain well controlled Hemoglobin 8.7.  Continue iron supplement.  Recheck hemoglobin in the morning Care management to assist with discharge to skilled nursing facility. Patient had a bowel movement Follow-up at [facility] clinic orthopedics in 2 weeks after discharge.  We will get x-rays of the left femur and remove staples.  DVT Prophylaxis - Xarelto, TED hose and SCDs Weight-Bearing as tolerated to left leg   Reche Dixon, PA-C [place] 12/03/2020, 7:07 AM

## 2022-03-07 ENCOUNTER — Ambulatory Visit
Admission: EM | Admit: 2022-03-07 | Discharge: 2022-03-07 | Disposition: A | Discharge status: Home / Self Care | Payer: Medicare Other

## 2022-03-07 ENCOUNTER — Other Ambulatory Visit: Payer: Self-pay

## 2022-03-07 DIAGNOSIS — R001 Bradycardia, unspecified: Secondary | ICD-10-CM | POA: Diagnosis not present

## 2022-03-07 DIAGNOSIS — L03113 Cellulitis of right upper limb: Secondary | ICD-10-CM | POA: Diagnosis not present

## 2022-03-07 DIAGNOSIS — I959 Hypotension, unspecified: Secondary | ICD-10-CM | POA: Diagnosis not present

## 2022-03-07 NOTE — Discharge Instructions (Signed)
You have an infection in your arm.  Since your blood pressure is low and your pulse is also low, there is concern that the infection could be into your bloodstream so you need to be worked up for possible sepsis and have imaging performed on your arm.  Also, you may receive IV antibiotics.  If you do not go to the ER your condition could worsen. ? ?You have been advised to follow up immediately in the emergency department for concerning signs.symptoms. If you declined EMS transport, please have a family member take you directly to the ED at this time. Do not delay. Based on concerns about condition, if you do not follow up in th e ED, you may risk poor outcomes including worsening of condition, delayed treatment and potentially life threatening issues. If you have declined to go to the ED at this time, you should call your PCP immediately to set up a follow up appointment. ? ?Go to ED for red flag symptoms, including; fevers you cannot reduce with Tylenol/Motrin, severe headaches, vision changes, numbness/weakness in part of the body, lethargy, confusion, intractable vomiting, severe dehydration, chest pain, breathing difficulty, severe persistent abdominal or pelvic pain, signs of severe infection (increased redness, swelling of an area), feeling faint or passing out, dizziness, etc. You should especially go to the ED for sudden acute worsening of condition if you do not elect to go at this time.  ?

## 2022-03-07 NOTE — ED Triage Notes (Signed)
Onset of arm pain 3 days ago. States her puppy scratched her. States her dog Is up to date on all shots.  It was red, swollen , and hot. States yesterday she hit the door and now lower right arm is black, and swollen.  ?

## 2022-03-07 NOTE — ED Provider Notes (Signed)
?[facility] ? ? ? ?CSN: [id number] ?Arrival date & time: 03/07/22  1516 ? ? ?  ? ?History   ?Chief Complaint ?Chief Complaint  ?Patient presents with  ? Arm Pain  ? ? ?HPI ?Katelyn Lee is a 86 y.o. female presenting with her daughter for concerns about pain in her right forearm.  Patient says her puppy scratched her 2 days ago.  She says today she noticed that her hands and and entire forearm were red, swollen and hot.  She also reports that yesterday she hit the door frame with her hand.  Reports a lot of bruising of the dorsal aspect of the right forearm.  Patient does take anticoagulants.  This has not been treated in any way.  She denies any fevers.  Denies numbness or tingling. ? ?Past medical history significant for atrial fibrillation (anticoagulated with Xarelto), vertigo, balance issues, stroke, hyperlipidemia, arthritis, and mild late onset Alzheimer's. ? ?HPI ? ?Past Medical History:  ?Diagnosis Date  ? A-fib ([place])   ? Arthritis   ? legs, lower back  ? Dyspnea   ? Dysrhythmia   ? A-FIB  ? Goiter   ? Insomnia   ? Stroke ([place]) 07/29/2017  ? Vertigo   ? Wears dentures   ? full upper and lower  ? Wears hearing aid in both ears   ? ? ?Patient Active Problem List  ? Diagnosis Date Noted  ? Closed nondisplaced intertrochanteric fracture of left femur, initial encounter ([place]) 11/27/2020  ? History of stroke 08/24/2017  ? Paroxysmal A-fib ([place]) 03/28/2015  ? Mixed hyperlipidemia 03/28/2015  ? ? ?Past Surgical History:  ?Procedure Laterality Date  ? CATARACT EXTRACTION W/PHACO Left 01/21/2020  ? Procedure: CATARACT EXTRACTION PHACO AND INTRAOCULAR LENS PLACEMENT (IOC) LEFT 8.97  00:57.9;  Surgeon: Eulogio Bear, MD;  Location: [facility];  Service: Ophthalmology;  Laterality: Left;  ? CATARACT EXTRACTION W/PHACO Right 03/10/2020  ? Procedure: CATARACT EXTRACTION PHACO AND INTRAOCULAR LENS PLACEMENT (IOC) RIGHT 11.55  01:10.2;  Surgeon: Eulogio Bear, MD;  Location: [facility];   Service: Ophthalmology;  Laterality: Right;  ? CHOLECYSTECTOMY    ? COLONOSCOPY    ? COLONOSCOPY WITH PROPOFOL N/A 06/28/2017  ? Procedure: COLONOSCOPY WITH PROPOFOL;  Surgeon: Lollie Sails, MD;  Location: [facility] ENDOSCOPY;  Service: Endoscopy;  Laterality: N/A;  ? HERNIA REPAIR    ? INTRAMEDULLARY (IM) NAIL INTERTROCHANTERIC Left 11/28/2020  ? Procedure: INTRAMEDULLARY (IM) NAIL INTERTROCHANTRIC;  Surgeon: Leim Fabry, MD;  Location: [facility] ORS;  Service: Orthopedics;  Laterality: Left;  ? TONSILLECTOMY    ? ? ?OB History   ?No obstetric history on file. ?  ? ? ? ?Home Medications   ? ?Prior to Admission medications   ?Medication Sig Start Date End Date Taking? Authorizing Provider  ?polyethylene glycol (MIRALAX / GLYCOLAX) 17 g packet Take 17 g by mouth daily. 12/03/20  Yes Shelly Coss, MD  ?rivaroxaban (XARELTO) 20 MG TABS tablet Take 20 mg by mouth daily after breakfast. 05/03/19  Yes [provider]  ?rosuvastatin (CRESTOR) 5 MG tablet Take 5 mg by mouth daily after breakfast. 10/28/20  Yes [provider]  ?sertraline (ZOLOFT) 100 MG tablet Take 150 mg by mouth daily after breakfast. 08/08/19  Yes [provider]  ?cholecalciferol (VITAMIN D) 25 MCG tablet Take 1 tablet (1,000 Units total) by mouth daily. 12/03/20   Shelly Coss, MD  ?docusate sodium (COLACE) 100 MG capsule Take 1 capsule (100 mg total) by mouth 2 (  two) times daily. 12/03/20   Shelly Coss, MD  ?ferrous [id number] C-folic acid (TRINSICON / FOLTRIN) capsule Take 1 capsule by mouth 2 (two) times daily. 12/03/20   Shelly Coss, MD  ?oxyCODONE (OXY IR/ROXICODONE) 5 MG immediate release tablet Take 0.5-1 tablets (2.5-5 mg total) by mouth every 6 (six) hours as needed for moderate pain or severe pain (pain score 4-6). 12/01/20   Reche Dixon, PA-C  ?traMADol (ULTRAM) 50 MG tablet Take 1 tablet (50 mg total) by mouth every 6 (six) hours as needed for moderate pain. 12/01/20   Reche Dixon, PA-C  ?Vitamin  D, Ergocalciferol, (DRISDOL) 1.25 MG (50000 UNIT) CAPS capsule Take 1 capsule (50,000 Units total) by mouth every 7 (seven) days. 12/03/20   Shelly Coss, MD  ?apixaban (ELIQUIS) 5 MG TABS tablet Take 5 mg by mouth 2 (two) times daily.  09/29/19  [provider]  ?gabapentin (NEURONTIN) 300 MG capsule Take 300 mg by mouth at bedtime.  09/29/19  [provider]  ?ranitidine (ZANTAC) 75 MG tablet Take 1 tablet (75 mg total) by mouth 2 (two) times daily. 05/22/16 09/29/19  Schaevitz, Randall An, MD  ?zolpidem (AMBIEN) 5 MG tablet Take 5 mg by mouth at bedtime as needed for sleep.  09/29/19  [provider]  ? ? ?Family History ?Family History  ?Problem Relation Age of Onset  ? Depression Mother   ? Heart disease Father   ? ? ?Social History ?Social History  ? ?Tobacco Use  ? Smoking status: Never  ?  Passive exposure: Current  ? Smokeless tobacco: Never  ?Vaping Use  ? Vaping Use: Never used  ?Substance Use Topics  ? Alcohol use: No  ? Drug use: No  ? ? ? ?Allergies   ?Fludrocortisone and Coumadin [warfarin sodium] ? ? ?Review of Systems ?Review of Systems  ?Constitutional:  Negative for fatigue and fever.  ?Respiratory:  Negative for shortness of breath.   ?Cardiovascular:  Negative for chest pain.  ?Musculoskeletal:  Positive for arthralgias, gait problem (chronic balance issues) and joint swelling.  ?Skin:  Positive for color change and wound.  ?Neurological:  Negative for dizziness, syncope, weakness and numbness.  ?Hematological:  Bruises/bleeds easily (on Xarelto).  ? ? ?Physical Exam ?Triage Vital Signs ?ED Triage Vitals  ?Enc Vitals Group  ?   BP   ?   Pulse   ?   Resp   ?   Temp   ?   Temp src   ?   SpO2   ?   Weight   ?   Height   ?   Head Circumference   ?   Peak Flow   ?   Pain Score   ?   Pain Loc   ?   Pain Edu?   ?   Excl. in [place]?   ? ?No data found. ? ?Updated Vital Signs ?BP (!) 84/46 (BP Location: Left Arm)   Pulse (!) 43   Temp 98.6 ?F (37 ?C) (Oral)   Ht '5\' 8"'$  (1.727  m)   Wt 151 lb (68.5 kg)   SpO2 95%   BMI 22.96 kg/m?  ? ? ?Physical Exam ?Vitals and nursing note reviewed.  ?Constitutional:   ?   General: She is not in acute distress. ?   Appearance: Normal appearance. She is not ill-appearing or toxic-appearing.  ?HENT:  ?   Head: Normocephalic and atraumatic.  ?Eyes:  ?   General: No scleral icterus.    ?   Right  eye: No discharge.     ?   Left eye: No discharge.  ?   Conjunctiva/sclera: Conjunctivae normal.  ?Cardiovascular:  ?   Rate and Rhythm: Regular rhythm. Bradycardia present.  ?   Heart sounds: Normal heart sounds.  ?Pulmonary:  ?   Effort: Pulmonary effort is normal. No respiratory distress.  ?   Breath sounds: Normal breath sounds.  ?Musculoskeletal:  ?   Cervical back: Neck supple.  ?   Comments: RIGHT FOREARM: There is a 1-1/2 cm linear laceration which is very superficial over the dorsal right wrist/distal forearm.  There is also significant purple and black/dark ecchymosis of the right hand and dorsal forearm to the mid aspect of the forearm.  Additionally there is moderate erythema and swelling from the palm all the way to just past the middle of the ventral and dorsal forearm.  Skin is hot to touch.  She has diffuse tenderness throughout forearm and wrist.  ?Skin: ?   General: Skin is dry.  ?Neurological:  ?   General: No focal deficit present.  ?   Mental Status: She is alert. Mental status is at baseline.  ?   Motor: No weakness.  ?   Gait: Gait normal.  ?Psychiatric:     ?   Mood and Affect: Mood normal.     ?   Behavior: Behavior normal.     ?   Thought Content: Thought content normal.  ? ? ? ?UC Treatments / Results  ?Labs ?(all labs ordered are listed, but only abnormal results are displayed) ?Labs Reviewed - No data to display ? ?EKG ? ? ?Radiology ?No results found. ? ?Procedures ?Procedures (including critical care time) ? ?Medications Ordered in UC ?Medications - No data to display ? ?Initial Impression / Assessment and Plan / UC Course  ?I have  reviewed the triage vital signs and the nursing notes. ? ?Pertinent labs & imaging results that were available during my care of the patient were reviewed by me and considered in my medical decision makin

## 2022-03-07 NOTE — ED Notes (Signed)
Patient is being discharged from the Urgent Care and sent to the [facility] Emergency Department via private vehicle with family member . Per Laurene Footman, PA, patient is in need of higher level of care due to possible sepsis. Patient is aware and verbalizes understanding of plan of care.  ?Vitals:  ? 03/07/22 1528  ?BP: (!) 84/46  ?Pulse: (!) 43  ?Temp: 98.6 ?F (37 ?C)  ?SpO2: 95%  ?  ?

## 2022-06-23 IMAGING — CR DG CHEST 1V PORT
1 series · 1 of 1 positions shown · non-contrast
Comparison: None.

CLINICAL DATA: Preop hip fracture

EXAM:
PORTABLE CHEST 1 VIEW

[chest ap]
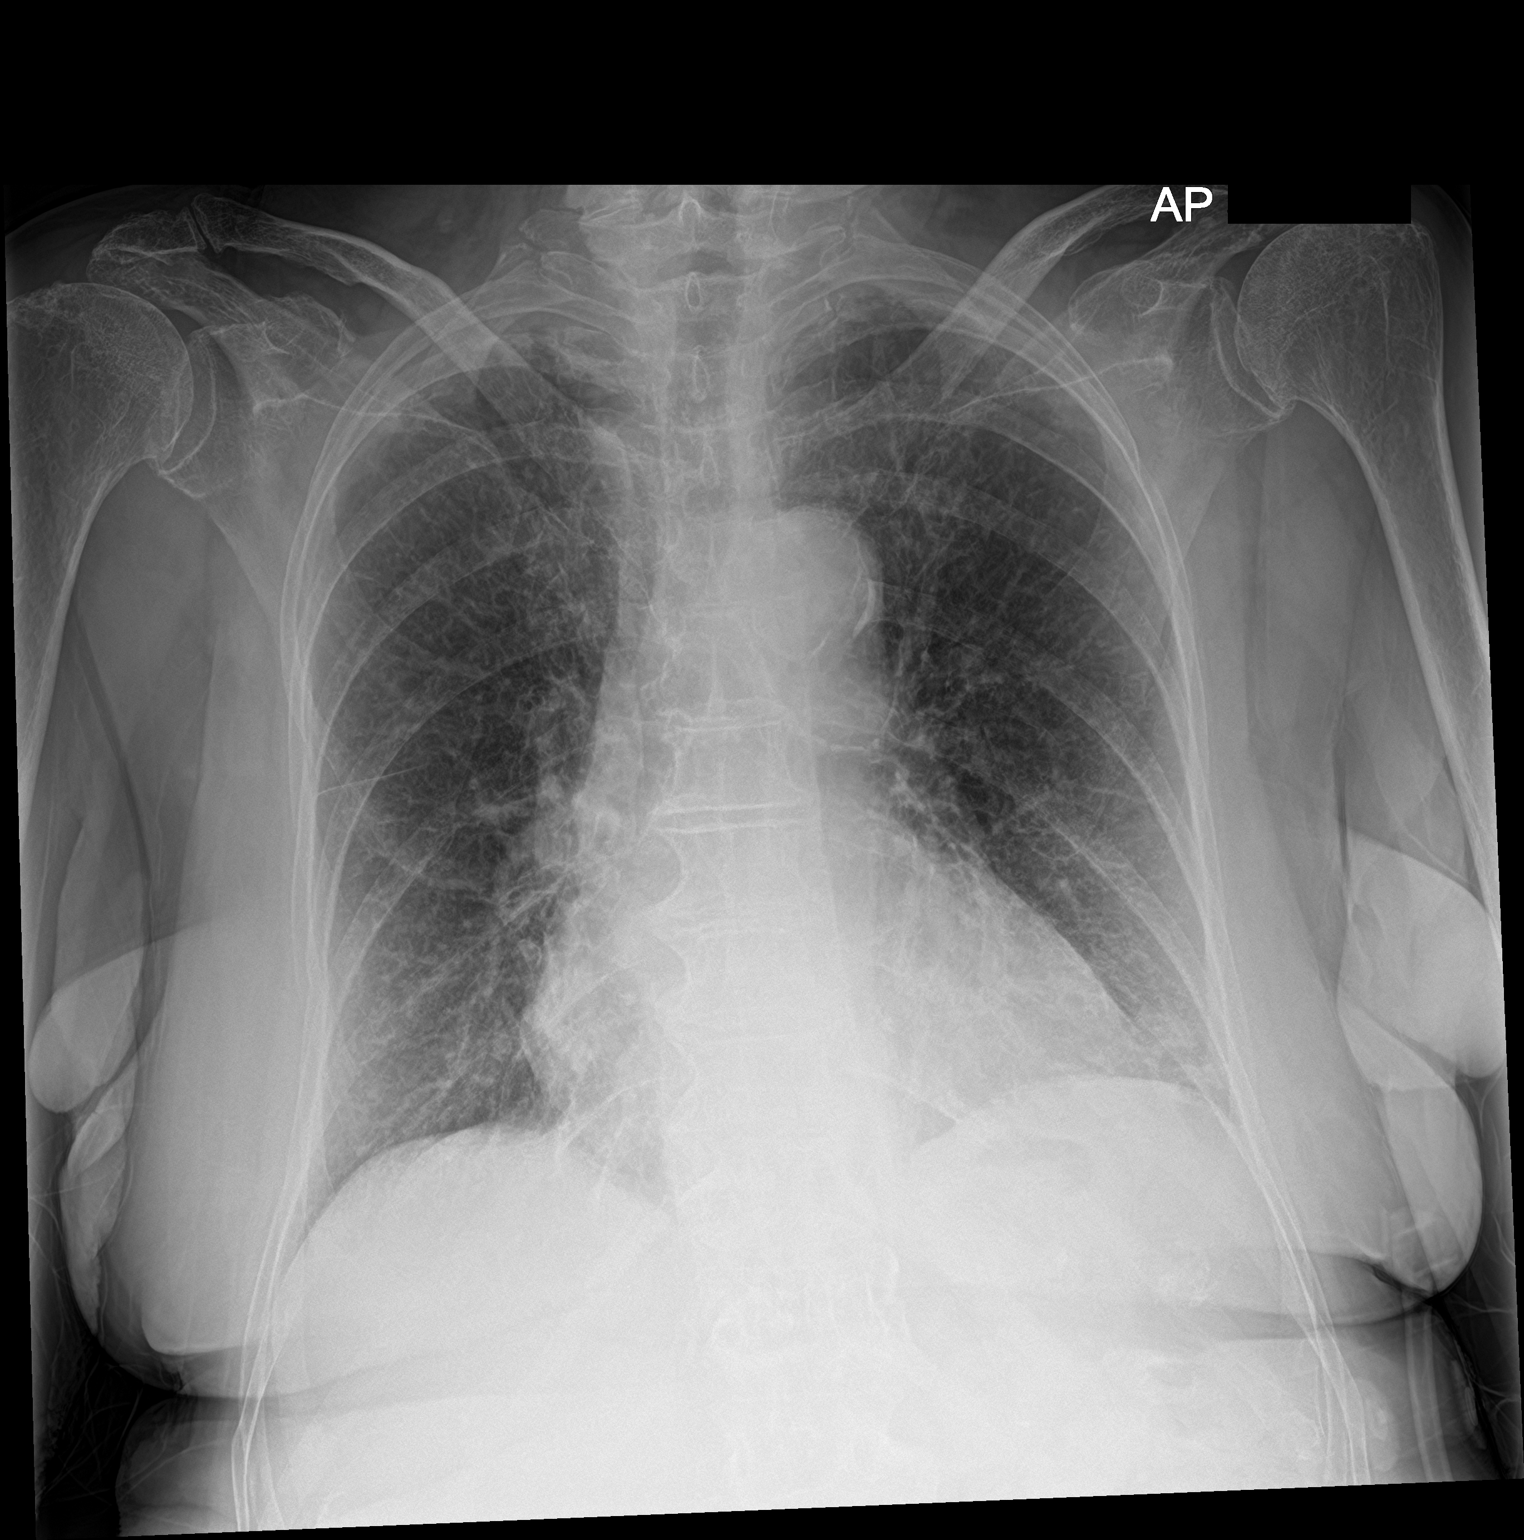

[1 of 1 positions shown; findings below may reference images not displayed]

FINDINGS: The heart size and mediastinal contours are within normal limits.
Aortic knob calcifications are seen. Both lungs are clear. The
visualized skeletal structures are unremarkable.
IMPRESSION: No active disease.

## 2022-06-24 IMAGING — RF DG HIP (WITH PELVIS) OPERATIVE*L*
1 series · 4 of 4 positions shown · non-contrast
Comparison: MRI, 11/27/2020

CLINICAL DATA: Imaging for ORIF of an intertrochanteric left
proximal femur fracture.

EXAM:
OPERATIVE LEFT HIP (WITH PELVIS IF PERFORMED) 4 VIEWS
TECHNIQUE: Fluoroscopic spot image(s) were submitted for interpretation
post-operatively.

[Series 1: run · 4 of 4 slices shown]
[im 1/4]
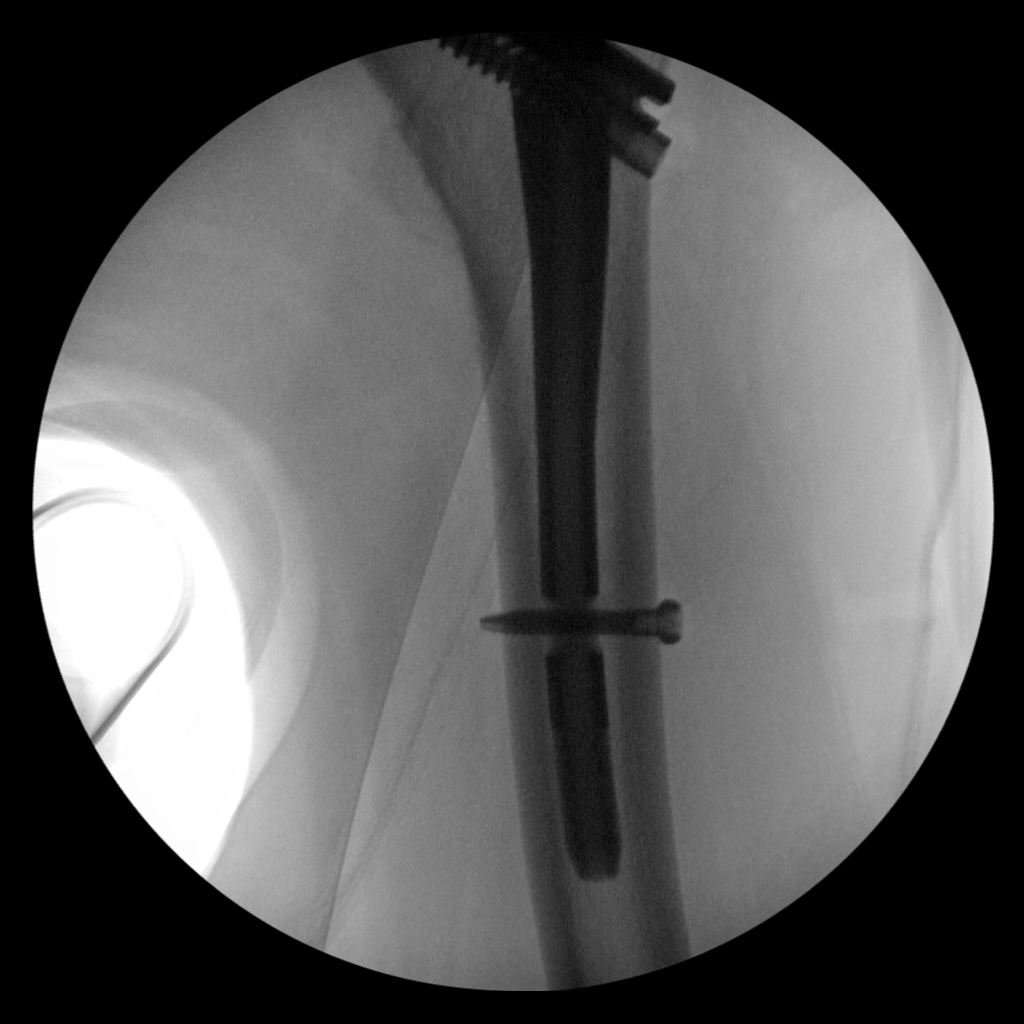
[im 2/4]
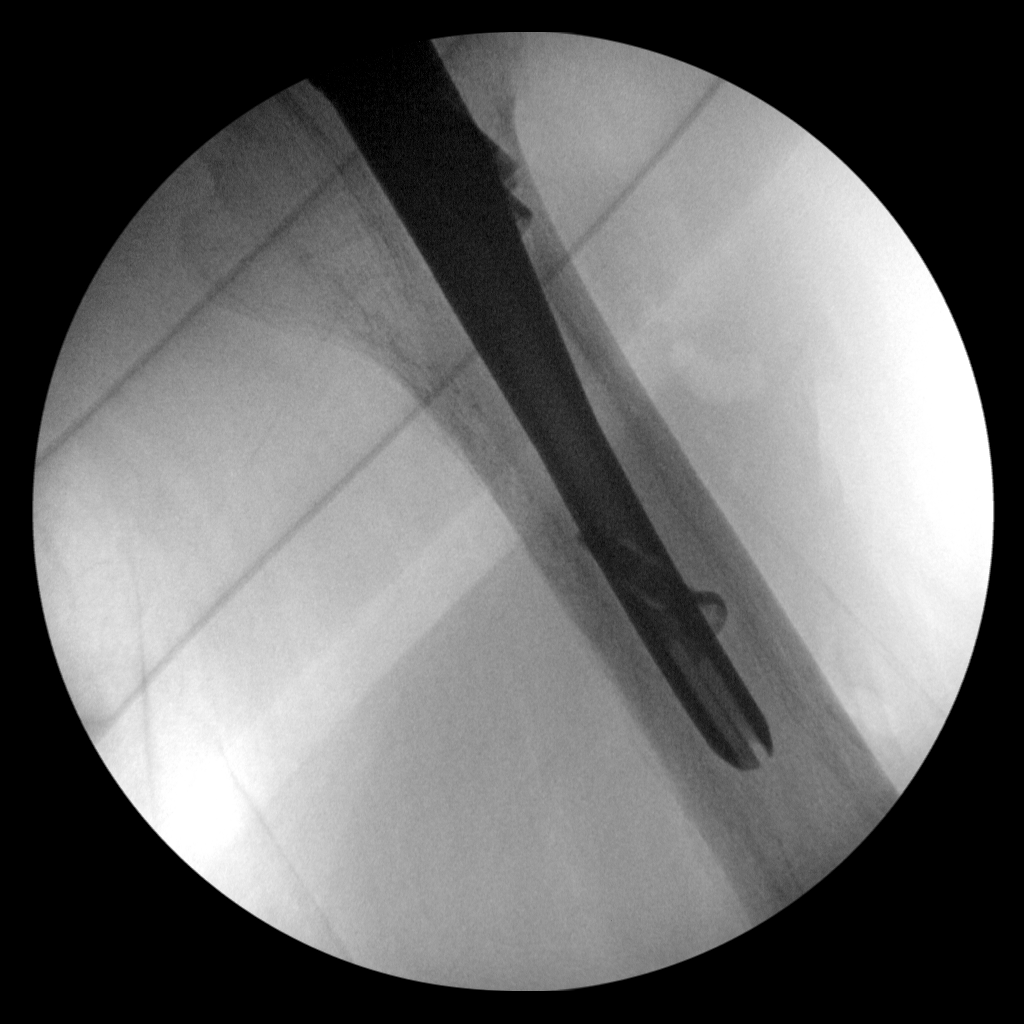
[im 3/4]
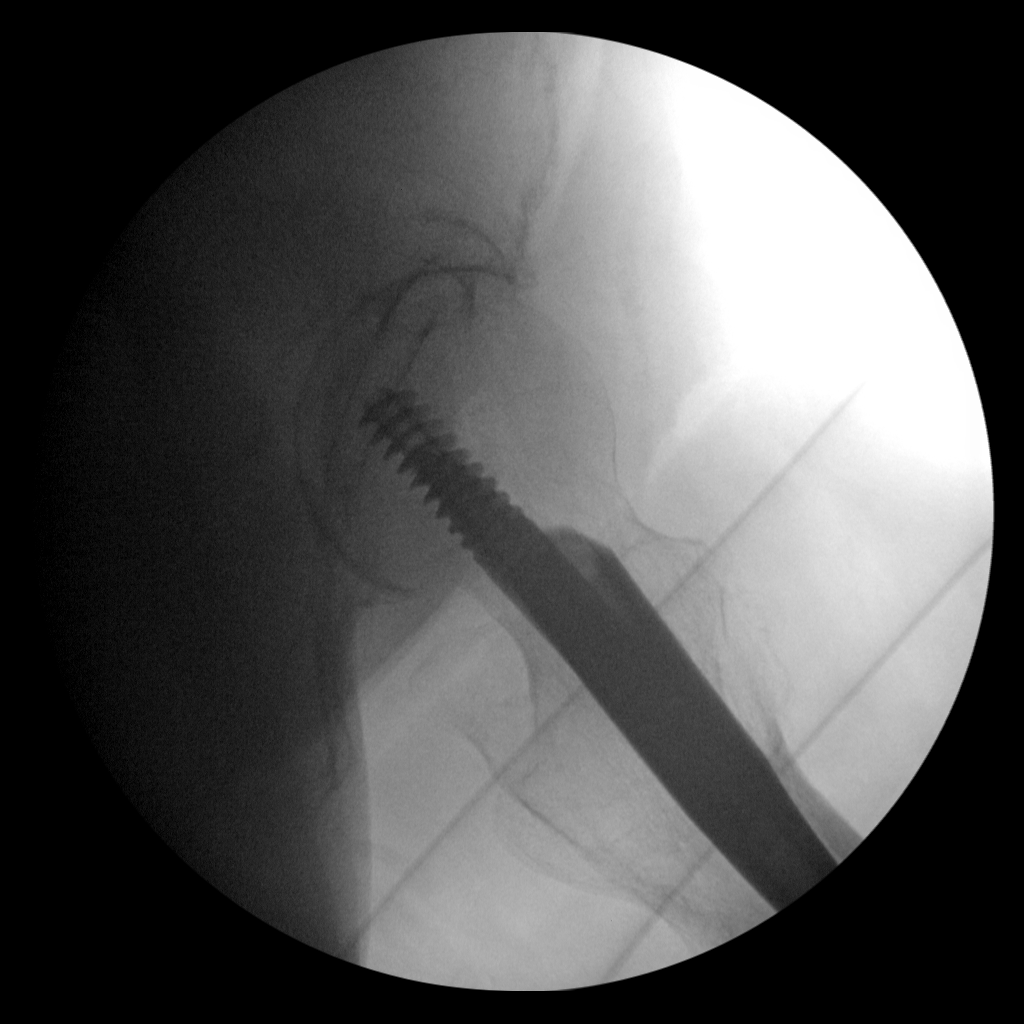
[im 4/4]
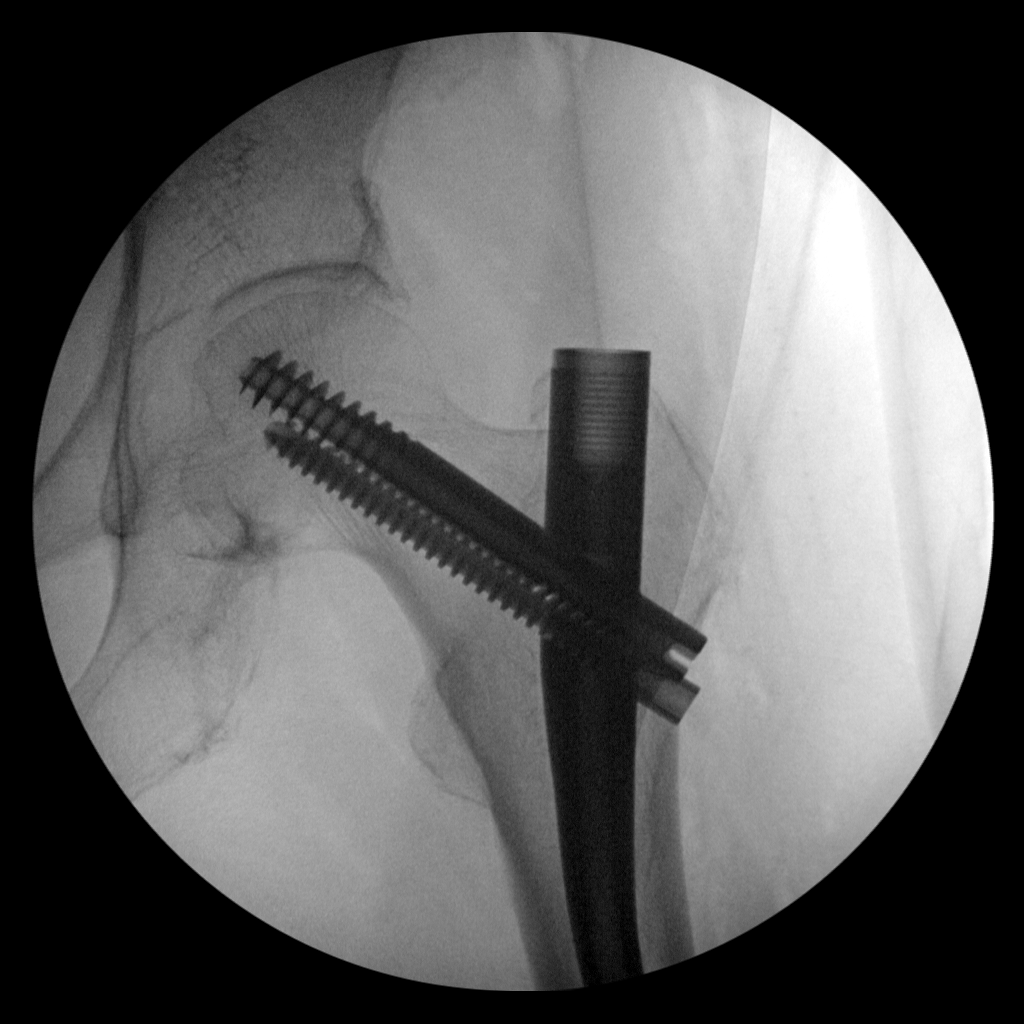

[4 of 4 positions shown; findings below may reference images not displayed]

FINDINGS: Short intramedullary rod extends from the greater trochanter to the
proximal diaphysis. Rod supports 2 compression screws extending to
the femoral head. The intertrochanteric fracture is not defined.
There is no evidence of displacement or angulation.

Orthopedic hardware is well-seated. No evidence of a fracture
complication.
IMPRESSION: 1. Well aligned proximal left femur fracture following ORIF.

## 2022-11-26 ENCOUNTER — Other Ambulatory Visit (HOSPITAL_COMMUNITY): Payer: Self-pay | Admitting: Orthopedic Surgery

## 2022-11-26 ENCOUNTER — Ambulatory Visit: Admission: RE | Admit: 2022-11-26 | Payer: Medicare Other | Source: Ambulatory Visit

## 2022-11-26 DIAGNOSIS — S32040A Wedge compression fracture of fourth lumbar vertebra, initial encounter for closed fracture: Secondary | ICD-10-CM

## 2022-11-30 ENCOUNTER — Ambulatory Visit
Admission: RE | Admit: 2022-11-30 | Discharge: 2022-11-30 | Disposition: A | Discharge status: Home / Self Care | Payer: Medicare Other | Source: Ambulatory Visit | Attending: Orthopedic Surgery | Admitting: Orthopedic Surgery

## 2022-11-30 DIAGNOSIS — S32040A Wedge compression fracture of fourth lumbar vertebra, initial encounter for closed fracture: Secondary | ICD-10-CM | POA: Diagnosis not present

## 2022-12-21 ENCOUNTER — Other Ambulatory Visit: Payer: Self-pay | Admitting: Orthopedic Surgery

## 2022-12-21 DIAGNOSIS — S32040A Wedge compression fracture of fourth lumbar vertebra, initial encounter for closed fracture: Secondary | ICD-10-CM

## 2022-12-23 ENCOUNTER — Ambulatory Visit
Admission: RE | Admit: 2022-12-23 | Discharge: 2022-12-23 | Disposition: A | Discharge status: Home / Self Care | Payer: Medicare Other | Source: Ambulatory Visit | Attending: Orthopedic Surgery | Admitting: Orthopedic Surgery

## 2022-12-23 DIAGNOSIS — S32040A Wedge compression fracture of fourth lumbar vertebra, initial encounter for closed fracture: Secondary | ICD-10-CM

## 2022-12-23 NOTE — H&P (Signed)
Interventional Radiology - Clinic Visit, Initial H&P    Referring Provider: Reche Dixon, PA-C  Reason for Visit: L4 compression fracture     History of Present Illness  Katelyn Lee is a 87 y.o. female with a relevant past medical history of prior hip fracture in 2021, and stroke (~5 years ago) on Xarelto seen today in Interventional Radiology clinic for L4 compression fracture.  Patient reports having back pain starting in October 2023 without direct trauma or known injury.  She was evaluated in the emergency room at that time, and was reportedly diagnosed with a compression fracture and was provided with a prescription for narcotic pain medicine.  She reports that she was unable to tolerate the oxycodone due to significant constipation.  She also reports no significant relief while taking the oxycodone.  She does take over-the-counter Tylenol, also without significant relief of her pain.  She reports her pain 10/10.    She more recently had an MRI of the lumbar spine on November 30, 2022 which confirmed the presence of a subacute compression fracture of L4 with approximately 30% height loss.    She reports significant disability on the [employer] disability questionnaire with 19/24 positive, and the stone states that she is chair bound most of the day and has significant pain reducing her overall mobility.    Past medical history is significant for remote stroke on Xarelto and paroxysmal atrial fibrillation.   She also had a prior hip fracture in 2021.      Additional Past Medical History Past Medical History:  Diagnosis Date   A-fib ([place])    Arthritis    legs, lower back   Dyspnea    Dysrhythmia    A-FIB   Goiter    Insomnia    Stroke ([place]) 07/29/2017   Vertigo    Wears dentures    full upper and lower   Wears hearing aid in both ears      Surgical History  Past Surgical History:  Procedure Laterality Date   CATARACT EXTRACTION W/PHACO Left 01/21/2020   Procedure:  CATARACT EXTRACTION PHACO AND INTRAOCULAR LENS PLACEMENT (IOC) LEFT 8.97  00:57.9;  Surgeon: Eulogio Bear, MD;  Location: [facility];  Service: Ophthalmology;  Laterality: Left;   CATARACT EXTRACTION W/PHACO Right 03/10/2020   Procedure: CATARACT EXTRACTION PHACO AND INTRAOCULAR LENS PLACEMENT (IOC) RIGHT 11.55  01:10.2;  Surgeon: Eulogio Bear, MD;  Location: [facility];  Service: Ophthalmology;  Laterality: Right;   CHOLECYSTECTOMY     COLONOSCOPY     COLONOSCOPY WITH PROPOFOL N/A 06/28/2017   Procedure: COLONOSCOPY WITH PROPOFOL;  Surgeon: Lollie Sails, MD;  Location: [facility] ENDOSCOPY;  Service: Endoscopy;  Laterality: N/A;   HERNIA REPAIR     INTRAMEDULLARY (IM) NAIL INTERTROCHANTERIC Left 11/28/2020   Procedure: INTRAMEDULLARY (IM) NAIL INTERTROCHANTRIC;  Surgeon: Leim Fabry, MD;  Location: [facility] ORS;  Service: Orthopedics;  Laterality: Left;   TONSILLECTOMY       Medications  I have reviewed the current medication list. Refer to chart for details. Current Outpatient Medications  Medication Instructions   acetaminophen (TYLENOL) 500 mg, Oral, Every 6 hours PRN   docusate sodium (COLACE) 100 mg, Oral, 2 times daily   ferrous [id number] C-folic acid (TRINSICON / FOLTRIN) capsule 1 capsule, Oral, 2 times daily   oxyCODONE (OXY IR/ROXICODONE) 2.5-5 mg, Oral, Every 6 hours PRN   polyethylene glycol (MIRALAX / GLYCOLAX) 17 g, Oral, Daily   rivaroxaban (XARELTO) 20 mg, Oral, Daily after  breakfast   rosuvastatin (CRESTOR) 5 mg, Oral, Daily after breakfast   sertraline (ZOLOFT) 150 mg, Oral, Daily after breakfast   traMADol (ULTRAM) 50 mg, Oral, Every 6 hours PRN   Vitamin D (Ergocalciferol) (DRISDOL) 50,000 Units, Oral, Every 7 days   vitamin D3 (CHOLECALCIFEROL) 1,000 Units, Oral, Daily      Allergies Allergies  Allergen Reactions   Fludrocortisone Other (See Comments)    Pt had a spike in her Blood pressure     Coumadin [Warfarin Sodium]  Rash   Does patient have contrast allergy: No     Physical Exam Current Vitals Temp: 97.8 F (36.6 C) (Temp Source: Oral)  Pulse Rate: 70  Resp: 14  BP: (!) 141/79  SpO2: 97 %        There is no height or weight on file to calculate BMI.  General: Alert and answers questions appropriately.  Cardiac: Regular rate. No dependent edema. Pulmonary: Normal work of breathing. On room air. Back: Tenderness to palpation in lower back overlying expected location of L4.    Pertinent Lab Results    Latest Ref Rng & Units 12/02/2020    4:19 AM 12/01/2020    4:52 AM 11/30/2020    5:19 AM  CBC  WBC 4.0 - 10.5 K/uL 6.3  6.8  7.4   Hemoglobin 12.0 - 15.0 g/dL 8.7  8.3  8.8   Hematocrit 36.0 - 46.0 % 26.2  25.2  27.3   Platelets 150 - 400 K/uL 220  199  202       Latest Ref Rng & Units 12/01/2020    4:52 AM 11/30/2020    5:19 AM 11/29/2020    4:27 AM  CMP  Glucose 70 - 99 mg/dL 105  99  145   BUN 8 - 23 mg/dL '21  20  23   '$ Creatinine 0.44 - 1.00 mg/dL 0.88  0.98  1.07   Sodium 135 - 145 mmol/L 136  136  138   Potassium 3.5 - 5.1 mmol/L 4.0  4.1  4.5   Chloride 98 - 111 mmol/L 104  105  106   CO2 22 - 32 mmol/L '24  25  23   '$ Calcium 8.9 - 10.3 mg/dL 8.4  8.4  8.7       Relevant and/or Recent Imaging: MRI L spine Nov 30 2022  IMPRESSION: 1. Acute to subacute L4 superior endplate compression fracture with 30% height loss. 2. Multilevel lumbar spondylosis as described above. Small left foraminal disc protrusion at L3-L4 encroaching on the exiting left L3 nerve root. 3. Moderate bilateral facet arthropathy at L3-L4 with prominent perifacet degenerative marrow edema, which can be a source of pain. Electronically Signed By: Titus Dubin M.D. On: 11/30/2022 12:12      Assessment & Plan:   Patient has suffered subacute osteoporotic fracture of the L4 vertebra.   History and exam have demonstrated the following:  Acute/Subacute fracture by imaging dated 11/30/2022,  Pain on exam concordant with level of fracture, Failure of conservative therapy and pain refractory to narcotic pain mediation, Inability to tolerate narcotic pain medication due to side effects, and Significant disability on the [place] with 19/24 positive symptoms, reflecting significant impact/impairment of (ADLs)   ICD-10-CM Codes that Support Medical Necessity ([email or web address]?articleId=[email or web address])  M80.08XA    Age-related osteoporosis with current pathological fracture, vertebra(e), initial encounter for fracture   Plan:  L4 vertebral body augmentation with balloon kyphoplasty  Post-procedure disposition: outpatient DRI-A  Medication holds: Xarelto, pending  The patient has suffered a fracture of the L4 vertebral body. It is recommended that patients aged 45 years or older be evaluated for possible testing or treatment of osteoporosis. A copy of this consult report is sent to the patient's referring physician.  Advanced Care Plan: The patient did not want to provide an [place] at the time of this visit     Total time spent on today's visit was over 40 Minutes, including both face-to-face time and non face-to-face time, personally spent on review of chart (including labs and relevant imaging), discussing further workup and treatment options, referral to specialist if needed, reviewing outside records if pertinent, answering patient questions, and coordinating care regarding L4 fracture as well as management strategy.       Albin Felling, MD  Vascular and Interventional Radiology 12/23/2022 9:05 AM

## 2022-12-27 ENCOUNTER — Other Ambulatory Visit: Payer: Self-pay | Admitting: Orthopedic Surgery

## 2022-12-27 DIAGNOSIS — M8008XA Age-related osteoporosis with current pathological fracture, vertebra(e), initial encounter for fracture: Secondary | ICD-10-CM

## 2022-12-29 MED ORDER — ACETAMINOPHEN 10 MG/ML IV SOLN
1000.0000 mg | Freq: Once | INTRAVENOUS | Status: AC
  Administered 2022-12-30: 1000 mg via INTRAVENOUS

## 2022-12-30 ENCOUNTER — Ambulatory Visit
Admission: RE | Admit: 2022-12-30 | Discharge: 2022-12-30 | Disposition: A | Discharge status: Home / Self Care | Payer: Medicare Other | Source: Ambulatory Visit | Attending: Orthopedic Surgery | Admitting: Orthopedic Surgery

## 2022-12-30 DIAGNOSIS — M8008XA Age-related osteoporosis with current pathological fracture, vertebra(e), initial encounter for fracture: Secondary | ICD-10-CM

## 2022-12-30 HISTORY — PX: IR KYPHO LUMBAR INC FX REDUCE BONE BX UNI/BIL CANNULATION INC/IMAGING: IMG5519

## 2022-12-30 MED ORDER — CEFAZOLIN SODIUM-DEXTROSE 2-4 GM/100ML-% IV SOLN
2.0000 g | INTRAVENOUS | Status: AC
  Administered 2022-12-30: 2 g via INTRAVENOUS

## 2022-12-30 MED ORDER — SODIUM CHLORIDE 0.9 % IV SOLN: INTRAVENOUS | Status: DC

## 2022-12-30 MED ORDER — MIDAZOLAM HCL 2 MG/2ML IJ SOLN
INTRAMUSCULAR | Status: AC | PRN
  Administered 2022-12-30 (×3): .5 mg via INTRAVENOUS

## 2022-12-30 MED ORDER — FENTANYL CITRATE PF 50 MCG/ML IJ SOSY: 25.0000 ug | PREFILLED_SYRINGE | INTRAMUSCULAR | Status: DC | PRN

## 2022-12-30 MED ORDER — FENTANYL CITRATE (PF) 100 MCG/2ML IJ SOLN
INTRAMUSCULAR | Status: AC | PRN
  Administered 2022-12-30 (×3): 25 ug via INTRAVENOUS

## 2022-12-30 MED ORDER — MIDAZOLAM HCL 2 MG/2ML IJ SOLN: 1.0000 mg | INTRAMUSCULAR | Status: DC | PRN

## 2022-12-30 NOTE — Discharge Instructions (Signed)
Kyphoplasty Post Procedure Discharge Instructions  May resume a regular diet and any medications that you routinely take (including pain medications). However, if you are taking Aspirin or an anticoagulant/blood thinner you will be told when you can resume taking these by the healthcare provider. No driving day of procedure. The day of your procedure take it easy. You may use an ice pack as needed to injection sites on back.  Ice to back 30 minutes on and 30 minutes off, as needed. May remove bandaids tomorrow after taking a shower. Replace daily with a clean bandaid until healed.  Do not lift anything heavier than a milk jug for 1-2 weeks or determined by your physician.  Follow up with your physician in 2 weeks.    Please contact our office at [phone] for the following symptoms or if you have any questions:  Fever greater than 100 degrees Increased swelling, pain, or redness at injection site. Increased back and/or leg pain New numbness or change in symptoms from before the procedure.    Thank you for visiting [facility].  May resume xarelto 24 hours after procedure!

## 2022-12-30 NOTE — Progress Notes (Signed)
Pt back in nursing recovery area. Pt still drowsy from procedure but will wake up when spoken to. Pt follows commands, talks in complete sentences and has no complaints at this time. Pt will remain in nursing station until discharge.  ?

## 2023-01-10 ENCOUNTER — Telehealth: Payer: Self-pay

## 2023-01-10 NOTE — Telephone Encounter (Signed)
Phone call to pt to follow up from her kyphoplasty on 01/06/23. Pt reports her pain is mostly gone post-procedure but sometimes feels like she has a pinched nerve. Pt reports she is able to move around a little better. Pt denies any signs of infection, redness at the site, draining or fever. Pt has no complaints at this time and will be scheduled for a telephone follow up with Dr. Denna Haggard next week. Pt advised to call back if anything were to change or any concerns arise and we will arrange an in person appointment. Pt verbalized understanding.

## 2023-01-18 ENCOUNTER — Inpatient Hospital Stay
Admission: RE | Admit: 2023-01-18 | Discharge: 2023-01-18 | Disposition: A | Discharge status: Home / Self Care | Payer: 59 | Source: Ambulatory Visit | Attending: Family Medicine | Admitting: Family Medicine

## 2023-01-19 ENCOUNTER — Other Ambulatory Visit: Payer: Self-pay | Admitting: Interventional Radiology

## 2023-01-19 DIAGNOSIS — S32040A Wedge compression fracture of fourth lumbar vertebra, initial encounter for closed fracture: Secondary | ICD-10-CM

## 2023-01-25 ENCOUNTER — Ambulatory Visit
Admission: RE | Admit: 2023-01-25 | Discharge: 2023-01-25 | Disposition: A | Discharge status: Home / Self Care | Payer: 59 | Source: Ambulatory Visit | Attending: Interventional Radiology | Admitting: Interventional Radiology

## 2023-01-25 DIAGNOSIS — S32040A Wedge compression fracture of fourth lumbar vertebra, initial encounter for closed fracture: Secondary | ICD-10-CM

## 2023-01-25 HISTORY — PX: IR RADIOLOGIST EVAL & MGMT: IMG5224

## 2023-01-25 NOTE — Progress Notes (Signed)
Chief Complaint: Patient was consulted remotely today ([facility]) for l4 compression fracture, subsequent encounter at the request of [name]    Referring Physician(s): Reche Dixon, PA-C   History of Present Illness: Katelyn Lee is a 87 y.o. female Who was recently seen in consultation for a highly symptomatic osteoporotic compression fracture of L4.  She was treated with cement augmentation and balloon kyphoplasty on 01/06/2023.  We spoke over the telephone today to assess her healing and symptomatic relief.  She tells me that her back is now feeling "wonderful".  She is extremely pleased that her back no longer hurts and this is no longer limiting her ability to perform her activities of daily living.  However, she reports that she feels "plum wore out" and has been quite fatigued this week.  This is relatively new compared to last week.  She denies chest pain, shortness of breath or other symptoms.  Past Medical History:  Diagnosis Date   A-fib ([place])    Arthritis    legs, lower back   Dyspnea    Dysrhythmia    A-FIB   Goiter    Insomnia    Stroke ([place]) 07/29/2017   Vertigo    Wears dentures    full upper and lower   Wears hearing aid in both ears     Past Surgical History:  Procedure Laterality Date   CATARACT EXTRACTION W/PHACO Left 01/21/2020   Procedure: CATARACT EXTRACTION PHACO AND INTRAOCULAR LENS PLACEMENT (IOC) LEFT 8.97  00:57.9;  Surgeon: Eulogio Bear, MD;  Location: [facility];  Service: Ophthalmology;  Laterality: Left;   CATARACT EXTRACTION W/PHACO Right 03/10/2020   Procedure: CATARACT EXTRACTION PHACO AND INTRAOCULAR LENS PLACEMENT (IOC) RIGHT 11.55  01:10.2;  Surgeon: Eulogio Bear, MD;  Location: [facility];  Service: Ophthalmology;  Laterality: Right;   CHOLECYSTECTOMY     COLONOSCOPY     COLONOSCOPY WITH PROPOFOL N/A 06/28/2017   Procedure: COLONOSCOPY WITH PROPOFOL;  Surgeon: Lollie Sails, MD;  Location:  [facility] ENDOSCOPY;  Service: Endoscopy;  Laterality: N/A;   HERNIA REPAIR     INTRAMEDULLARY (IM) NAIL INTERTROCHANTERIC Left 11/28/2020   Procedure: INTRAMEDULLARY (IM) NAIL INTERTROCHANTRIC;  Surgeon: Leim Fabry, MD;  Location: [facility] ORS;  Service: Orthopedics;  Laterality: Left;   IR KYPHO LUMBAR INC FX REDUCE BONE BX UNI/BIL CANNULATION INC/IMAGING  12/30/2022   TONSILLECTOMY      Allergies: Fludrocortisone and Coumadin [warfarin sodium]  Medications: Prior to Admission medications   Medication Sig Start Date End Date Taking? Authorizing Provider  acetaminophen (TYLENOL) 500 MG tablet Take 500 mg by mouth every 6 (six) hours as needed.    [provider]  cholecalciferol (VITAMIN D) 25 MCG tablet Take 1 tablet (1,000 Units total) by mouth daily. Patient not taking: Reported on 12/23/2022 12/03/20   Shelly Coss, MD  docusate sodium (COLACE) 100 MG capsule Take 1 capsule (100 mg total) by mouth 2 (two) times daily. Patient not taking: Reported on 12/23/2022 12/03/20   Shelly Coss, MD  ferrous [id number] C-folic acid (TRINSICON / FOLTRIN) capsule Take 1 capsule by mouth 2 (two) times daily. Patient not taking: Reported on 12/23/2022 12/03/20   Shelly Coss, MD  oxyCODONE (OXY IR/ROXICODONE) 5 MG immediate release tablet Take 0.5-1 tablets (2.5-5 mg total) by mouth every 6 (six) hours as needed for moderate pain or severe pain (pain score 4-6). Patient not taking: Reported on 12/23/2022 12/01/20   Reche Dixon, PA-C  polyethylene glycol [facility] / Floria Raveling)  17 g packet Take 17 g by mouth daily. Patient not taking: Reported on 12/23/2022 12/03/20   Shelly Coss, MD  rivaroxaban (XARELTO) 20 MG TABS tablet Take 20 mg by mouth daily after breakfast. 05/03/19   [provider]  rosuvastatin (CRESTOR) 5 MG tablet Take 5 mg by mouth daily after breakfast. 10/28/20   [provider]  sertraline (ZOLOFT) 100 MG tablet Take 150 mg by mouth daily after breakfast.  08/08/19   [provider]  traMADol (ULTRAM) 50 MG tablet Take 1 tablet (50 mg total) by mouth every 6 (six) hours as needed for moderate pain. Patient not taking: Reported on 12/23/2022 12/01/20   Reche Dixon, PA-C  Vitamin D, Ergocalciferol, (DRISDOL) 1.25 MG (50000 UNIT) CAPS capsule Take 1 capsule (50,000 Units total) by mouth every 7 (seven) days. Patient not taking: Reported on 12/23/2022 12/03/20   Shelly Coss, MD  apixaban (ELIQUIS) 5 MG TABS tablet Take 5 mg by mouth 2 (two) times daily.  09/29/19  [provider]  gabapentin (NEURONTIN) 300 MG capsule Take 300 mg by mouth at bedtime.  09/29/19  [provider]  ranitidine (ZANTAC) 75 MG tablet Take 1 tablet (75 mg total) by mouth 2 (two) times daily. 05/22/16 09/29/19  Schaevitz, Randall An, MD  zolpidem (AMBIEN) 5 MG tablet Take 5 mg by mouth at bedtime as needed for sleep.  09/29/19  [provider]     Family History  Problem Relation Age of Onset   Depression Mother    Heart disease Father     Social History   Socioeconomic History   Marital status: Divorced    Spouse name: Not on file   Number of children: Not on file   Years of education: Not on file   Highest education level: Not on file  Occupational History   Not on file  Tobacco Use   Smoking status: Never    Passive exposure: Current   Smokeless tobacco: Never  Vaping Use   Vaping Use: Never used  Substance and Sexual Activity   Alcohol use: No   Drug use: No   Sexual activity: Not on file  Other Topics Concern   Not on file  Social History Narrative   Not on file   Social Determinants of Health   Financial Resource Strain: Not on file  Food Insecurity: Not on file  Transportation Needs: Not on file  Physical Activity: Not on file  Stress: Not on file  Social Connections: Not on file    Review of Systems  Review of Systems: A 12 point ROS discussed and pertinent positives are indicated in the HPI above.   All other systems are negative.   Physical Exam No direct physical exam was performed (except for noted visual exam findings with Video Visits).    Vital Signs: There were no vitals taken for this visit.  Imaging: IR KYPHO LUMBAR INC FX REDUCE BONE BX UNI/BIL CANNULATION INC/IMAGING  Result Date: 12/30/2022 CLINICAL DATA:  87 year old female with highly symptomatic osteoporotic L4 compression fracture. She presents for cement augmentation with balloon kyphoplasty. EXAM: FLUOROSCOPIC GUIDED KYPHOPLASTY OF THE L4 VERTEBRAL BODY COMPARISON:  None Available. MEDICATIONS: As antibiotic prophylaxis, 2 g Ancef was ordered pre-procedure and administered intravenously within 1 hour of incision. ANESTHESIA/SEDATION: Moderate (conscious) sedation was employed during this procedure. A total of Versed 1.5 mg and Fentanyl 75 mcg was administered intravenously. Moderate Sedation Time: 29 minutes. The patient's level of consciousness and vital signs were monitored continuously by  radiology nursing throughout the procedure under my direct supervision. FLUOROSCOPY TIME:  Radiation exposure index: 46.1 mGy reference air kerma COMPLICATIONS: None immediate. PROCEDURE: The procedure, risks (including but not limited to bleeding, infection, organ damage), benefits, and alternatives were explained to the patient. Questions regarding the procedure were encouraged and answered. The patient understands and consents to the procedure. The patient was placed prone on the fluoroscopic table. The skin overlying the upper thoracic region was then prepped and draped in the usual sterile fashion. Maximal barrier sterile technique was utilized including caps, mask, sterile gowns, sterile gloves, sterile drape, hand hygiene and skin antiseptic. Intravenous Fentanyl and Versed were administered as conscious sedation during continuous cardiorespiratory monitoring by the radiology RN. The left pedicle at L4 was then infiltrated with 1%  lidocaine followed by the advancement of a Kyphon trocar needle through the left pedicle into the posterior one-third of the vertebral body. Subsequently, the osteo drill was advanced to the anterior third of the vertebral body. The osteo drill was retracted. Through the working cannula, a Kyphon inflatable bone tamp 15 x 2.5 was advanced and positioned with the distal marker approximately 5 mm from the anterior aspect of the cortex. Appropriate positioning was confirmed on the AP projection. At this time, the balloon was expanded using contrast via a Kyphon inflation syringe device via micro tubing. In similar fashion, the right L4 pedicle was infiltrated with 1% lidocaine followed by the advancement of a second Kyphon trocar needle through the right pedicle into the posterior third of the vertebral body. Again, a bone biopsy was obtained at this location. Subsequently, the osteo drill was coaxially advanced to the anterior right third. The osteo drill was exchanged for a Kyphon inflatable bone tamp 15 x 2.5, advanced to the 5 mm of the anterior aspect of the cortex. The balloon was then expanded using contrast as above. Inflations were continued until there was near apposition with the superior end plate. At this time, methylmethacrylate mixture was reconstituted in the Kyphon bone mixing device system. This was then loaded into the delivery mechanism, attached to Kyphon bone fillers. The balloons were deflated and removed followed by the instillation of methylmethacrylate mixture with excellent filling in the AP and lateral projections. No extravasation was noted in the disk spaces or posteriorly into the spinal canal. No epidural venous contamination was seen. The working cannulae and the bone filler were then retrieved and removed. Hemostasis was achieved with manual compression. The patient tolerated the procedure well without immediate postprocedural complication. IMPRESSION: 1. Technically successful L4  vertebral body augmentation using balloon kyphoplasty. 2. Per CMS PQRS reporting requirements (PQRS Measure 24): Given the patient's age of greater than 17 and the fracture site (hip, distal radius, or spine), the patient should be tested for osteoporosis using DXA, and the appropriate treatment considered based on the DXA results. Electronically Signed   By: Jacqulynn Cadet M.D.   On: 12/30/2022 10:38    Labs:  CBC: No results for input(s): "WBC", "HGB", "HCT", "PLT" in the last 8760 hours.  COAGS: No results for input(s): "INR", "APTT" in the last 8760 hours.  BMP: No results for input(s): "NA", "K", "CL", "CO2", "GLUCOSE", "BUN", "CALCIUM", "CREATININE", "GFRNONAA", "GFRAA" in the last 8760 hours.  Invalid input(s): "CMP"  LIVER FUNCTION TESTS: No results for input(s): "BILITOT", "AST", "ALT", "ALKPHOS", "PROT", "ALBUMIN" in the last 8760 hours.  TUMOR MARKERS: No results for input(s): "AFPTM", "CEA", "CA199", "CHROMGRNA" in the last 8760 hours.  Assessment and Plan:  Pleasant  87 year old female with a history of symptomatic osteoporotic L4 compression fracture now 2 weeks status post cement augmentation with balloon kyphoplasty.  She reports that her back feels wonderful.  She is thrilled that she no longer has back pain.  Unfortunately, she is feeling poorly overall and just feels tired and worn out.  I encouraged her strongly to follow-up with her primary care physician for her feelings of general malaise.  I was pleased to hear that her back pain has completely resolved.  No further follow-up at this time.  If she develops recurrent back pain concerning for a new fracture, we will be happy to see her again at any point in the future.   Electronically Signed: Criselda Peaches 01/25/2023, 3:12 PM   I spent a total of  10 Minutes in remote  clinical consultation, greater than 50% of which was counseling/coordinating care for L4 compression fracture.    Visit type: Audio  only (telephone). Audio (no video) only due to patient's lack of internet/smartphone capability. Alternative for in-person consultation at [facility], [address], [place], [place]. This visit type was conducted due to national recommendations for restrictions regarding the COVID-19 Pandemic (e.g. social distancing).  This format is felt to be most appropriate for this patient at this time.  All issues noted in this document were discussed and addressed.

## 2023-07-19 ENCOUNTER — Encounter: Payer: Self-pay | Admitting: Emergency Medicine

## 2023-07-19 ENCOUNTER — Emergency Department: Payer: 59

## 2023-07-19 ENCOUNTER — Emergency Department
Admission: EM | Admit: 2023-07-19 | Discharge: 2023-07-19 | Disposition: A | Discharge status: Home / Self Care | Payer: 59 | Attending: Emergency Medicine | Admitting: Emergency Medicine

## 2023-07-19 ENCOUNTER — Other Ambulatory Visit: Payer: Self-pay

## 2023-07-19 ENCOUNTER — Ambulatory Visit: Admission: EM | Admit: 2023-07-19 | Discharge: 2023-07-19 | Payer: 59

## 2023-07-19 DIAGNOSIS — J189 Pneumonia, unspecified organism: Secondary | ICD-10-CM | POA: Insufficient documentation

## 2023-07-19 DIAGNOSIS — R059 Cough, unspecified: Secondary | ICD-10-CM | POA: Diagnosis present

## 2023-07-19 DIAGNOSIS — I447 Left bundle-branch block, unspecified: Secondary | ICD-10-CM | POA: Insufficient documentation

## 2023-07-19 DIAGNOSIS — I4891 Unspecified atrial fibrillation: Secondary | ICD-10-CM

## 2023-07-19 DIAGNOSIS — R079 Chest pain, unspecified: Secondary | ICD-10-CM

## 2023-07-19 DIAGNOSIS — R051 Acute cough: Secondary | ICD-10-CM | POA: Diagnosis not present

## 2023-07-19 LAB — CBC WITH DIFFERENTIAL/PLATELET
Abs Immature Granulocytes: 0.03 10*3/uL (ref 0.00–0.07)
Basophils Absolute: 0 10*3/uL (ref 0.0–0.1)
Basophils Relative: 0 %
Eosinophils Absolute: 0.1 10*3/uL (ref 0.0–0.5)
Eosinophils Relative: 1 %
HCT: 36.4 % (ref 36.0–46.0)
Hemoglobin: 11.9 g/dL — ABNORMAL LOW (ref 12.0–15.0)
Immature Granulocytes: 0 %
Lymphocytes Relative: 13 %
Lymphs Abs: 1.1 10*3/uL (ref 0.7–4.0)
MCH: 28.6 pg (ref 26.0–34.0)
MCHC: 32.7 g/dL (ref 30.0–36.0)
MCV: 87.5 fL (ref 80.0–100.0)
Monocytes Absolute: 0.8 10*3/uL (ref 0.1–1.0)
Monocytes Relative: 9 %
Neutro Abs: 6.9 10*3/uL (ref 1.7–7.7)
Neutrophils Relative %: 77 %
Platelets: 226 10*3/uL (ref 150–400)
RBC: 4.16 MIL/uL (ref 3.87–5.11)
RDW: 16.3 % — ABNORMAL HIGH (ref 11.5–15.5)
WBC: 9 10*3/uL (ref 4.0–10.5)
nRBC: 0 % (ref 0.0–0.2)

## 2023-07-19 LAB — COMPREHENSIVE METABOLIC PANEL
ALT: 16 U/L (ref 0–44)
AST: 17 U/L (ref 15–41)
Albumin: 3.3 g/dL — ABNORMAL LOW (ref 3.5–5.0)
Alkaline Phosphatase: 62 U/L (ref 38–126)
Anion gap: 4 — ABNORMAL LOW (ref 5–15)
BUN: 27 mg/dL — ABNORMAL HIGH (ref 8–23)
CO2: 23 mmol/L (ref 22–32)
Calcium: 8.9 mg/dL (ref 8.9–10.3)
Chloride: 111 mmol/L (ref 98–111)
Creatinine, Ser: 0.94 mg/dL (ref 0.44–1.00)
GFR, Estimated: 59 mL/min — ABNORMAL LOW (ref >60)
Glucose, Bld: 110 mg/dL — ABNORMAL HIGH (ref 70–99)
Potassium: 3.8 mmol/L (ref 3.5–5.1)
Sodium: 138 mmol/L (ref 135–145)
Total Bilirubin: 0.6 mg/dL (ref 0.3–1.2)
Total Protein: 6.3 g/dL — ABNORMAL LOW (ref 6.5–8.1)

## 2023-07-19 LAB — TROPONIN I (HIGH SENSITIVITY)
Troponin I (High Sensitivity): 8 ng/L (ref <18)
Troponin I (High Sensitivity): 10 ng/L (ref <18)

## 2023-07-19 MED ORDER — AZITHROMYCIN 250 MG PO TABS: ORAL_TABLET | ORAL | 0 refills | Status: AC

## 2023-07-19 MED ORDER — SODIUM CHLORIDE 0.9 % IV SOLN
1.0000 g | Freq: Once | INTRAVENOUS | Status: AC
  Administered 2023-07-19: 1 g via INTRAVENOUS
  Filled 2023-07-19: qty 10

## 2023-07-19 MED ORDER — ACETAMINOPHEN 500 MG PO TABS
1000.0000 mg | ORAL_TABLET | Freq: Once | ORAL | Status: AC
  Administered 2023-07-19: 1000 mg via ORAL
  Filled 2023-07-19: qty 2

## 2023-07-19 NOTE — ED Provider Notes (Signed)
[facility] Provider Note  Patient Contact: 5:08 PM (approximate)   History   Cough (/)   HPI  Katelyn Lee is a 87 y.o. female who presents emergency department from urgent care for A-fib on EKG.  Patient went to the urgent care for concerns for possible pneumonia.  Patient been coughing, having some mildly increased shortness of breath from her chronic shortness of breath.  While there they did an EKG and revealed that she was in A-fib.  Patient states that she has a history of it, is not supposed to take any kind of rate or rhythm control medications.  She says that it is paroxysmal and she will go in and out of A-fib.  Patient has no chest pain.  She states that she feels like she has a mild touch pneumonia.  There is been no fevers, chills, peripheral edema.     Physical Exam   Triage Vital Signs: ED Triage Vitals  Encounter Vitals Group     BP 07/19/23 1615 126/88     Systolic BP Percentile --      Diastolic BP Percentile --      Pulse Rate 07/19/23 1615 (!) 122     Resp 07/19/23 1615 20     Temp 07/19/23 1615 98.6 F (37 C)     Temp Source 07/19/23 1615 Oral     SpO2 07/19/23 1615 97 %     Weight 07/19/23 1616 145 lb (65.8 kg)     Height 07/19/23 1616 5\' 8"  (1.727 m)     Head Circumference --      Peak Flow --      Pain Score 07/19/23 1616 9     Pain Loc --      Pain Education --      Exclude from Growth Chart --     Most recent vital signs: Vitals:   07/19/23 1615 07/19/23 1742  BP: 126/88 (!) 141/100  Pulse: (!) 122 (!) 117  Resp: 20 18  Temp: 98.6 F (37 C)   SpO2: 97% 97%     General: Alert and in no acute distress. ENT:      Ears:       Nose: No congestion/rhinnorhea.      Mouth/Throat: Mucous membranes are moist. Neck: No stridor. No cervical spine tenderness to palpation  Cardiovascular:  Good peripheral perfusion Respiratory: Normal respiratory effort without tachypnea or retractions. Lungs CTAB. Good air entry to  the bases with no decreased or absent breath sounds. Musculoskeletal: Full range of motion to all extremities.  Neurologic:  No gross focal neurologic deficits are appreciated.  Skin:   No rash noted Other:   ED Results / Procedures / Treatments   Labs (all labs ordered are listed, but only abnormal results are displayed) Labs Reviewed  COMPREHENSIVE METABOLIC PANEL - Abnormal; Notable for the following components:      Result Value   Glucose, Bld 110 (*)    BUN 27 (*)    Total Protein 6.3 (*)    Albumin 3.3 (*)    GFR, Estimated 59 (*)    Anion gap 4 (*)    All other components within normal limits  CBC WITH DIFFERENTIAL/PLATELET - Abnormal; Notable for the following components:   Hemoglobin 11.9 (*)    RDW 16.3 (*)    All other components within normal limits  TROPONIN I (HIGH SENSITIVITY)  TROPONIN I (HIGH SENSITIVITY)     EKG  ED ECG REPORT I,  Delorise Royals Darelle Kings,  personally viewed and interpreted this ECG.   Date: 07/19/2023  EKG Time: 1450 hrs.  Rate: 112 bpm  Rhythm: atrial fibrillation, rate 112  Axis: Leftward axis  Intervals:left bundle branch block  ST&T Change: Appropriate discordance on EKG no other concerning ST elevation or depression noted  A-fib with a rate of 112.  Left bundle branch block, does not meet Sgarbossa criteria.    RADIOLOGY   personally viewed, evaluated, and interpreted these images as part of my medical decision making, as well as reviewing the written report by the radiologist.  ED Provider Interpretation: Interstitial opacities concerning for pneumonia.  DG Chest 2 View  Result Date: 07/19/2023 CLINICAL DATA:  Chest pain, cough EXAM: CHEST - 2 VIEW COMPARISON:  11/27/2020 FINDINGS: Transverse diameter of heart is increased. Increased interstitial markings are seen in the parahilar regions and lower lung fields on both sides. Pleural thickening is seen in both apices, more so on the right side. There is blunting of both  costophrenic angles. There is no pneumothorax. IMPRESSION: Cardiomegaly. Increased interstitial markings are seen in both lungs suggesting interstitial pulmonary edema or interstitial pneumonia. Small bilateral pleural effusions, more so on the right side. Electronically Signed   By: Ernie Avena M.D.   On: 07/19/2023 17:30    PROCEDURES:  Critical Care performed: No  Procedures   MEDICATIONS ORDERED IN ED: Medications  cefTRIAXone (ROCEPHIN) 1 g in sodium chloride 0.9 % 100 mL IVPB (1 g Intravenous New Bag/Given 07/19/23 2201)  acetaminophen (TYLENOL) tablet 1,000 mg (1,000 mg Oral Given 07/19/23 2004)     IMPRESSION / MDM / ASSESSMENT AND PLAN / ED COURSE  I reviewed the triage vital signs and the nursing notes.                                 Differential diagnosis includes, but is not limited to, pneumonia, pulmonary edema, STEMI/ACS, NSTEMI, PE   Patient's presentation is most consistent with acute presentation with potential threat to life or bodily function.   Patient's diagnosis is consistent with commune acquired pneumonia.  Patient presents emergency department from an urgent care with a cough, some shortness of breath.  Patient states that she has baseline shortness of breath, has had some increased cough and is felt like she may have pneumonia.  No fevers at home.  No true difficulty breathing.  While there patient was noted to have A-fib on her EKG and was sent to the ED for evaluation.  Patient had A-fib with a left bundle branch block.  Negative Sgarbossa criteria.  Troponins were negative.  Labs are reassuring with no elevation of the white blood cell count.  X-ray is consistent with what appears to be some pneumonia.  Given the patient's age, symptoms, I did discuss admission for IV antibiotics and cardiac monitoring.  Patient is highly desirous of going home.  Patient is capable of making her own decisions, she is aware of the risk.  However with the exception of  her A-fib her workup is overall reassuring.  Patient has known A-fib, and I feel that this is likely secondary to her pneumonia.  For any increasing chest pain, shortness of breath, fevers or other concerning symptoms patient should return to the ED.  Otherwise recommend following up with primary care to ensure A-fib resolves and pneumonia improves..  Patient is given ED precautions to return to the ED for any worsening or  new symptoms.     FINAL CLINICAL IMPRESSION(S) / ED DIAGNOSES   Final diagnoses:  Community acquired pneumonia, unspecified laterality     Rx / DC Orders   ED Discharge Orders          Ordered    azithromycin (ZITHROMAX Z-PAK) 250 MG tablet        07/19/23 2213             Note:  This document was prepared using Dragon voice recognition software and may include unintentional dictation errors.   Racheal Patches, PA-C 07/19/23 2216    Corena Herter, MD 07/20/23 [phone]

## 2023-07-19 NOTE — ED Provider Notes (Signed)
[facility]    CSN: [id number] Arrival date & time: 07/19/23  1437      History   Chief Complaint Chief Complaint  Patient presents with   chest heaviness   Shortness of Breath   Cough    HPI Katelyn Lee is a 87 y.o. female presents for chest pain/pressure/heaviness, shortness of breath, fatigue, and dry cough x 2 days. Denies fever, body aches, sore throat, nasal congestion, abdominal pain, n/v/d. Denies sick contacts. Patient presents without any family or friends today.   Past medical history significant for atrial fibrillation (anticoagulated with Xarelto), vertigo, balance issues, stroke, hyperlipidemia, arthritis, and mild late onset Alzheimer's. Patient states she has been taking medications as directed and has not missed any medication doses.    HPI  Past Medical History:  Diagnosis Date   A-fib (HCC)    Arthritis    legs, lower back   Dyspnea    Dysrhythmia    A-FIB   Goiter    Heart failure (HCC)    Insomnia    Stroke (HCC) 07/29/2017   Vertigo    Wears dentures    full upper and lower   Wears hearing aid in both ears     Patient Active Problem List   Diagnosis Date Noted   Closed nondisplaced intertrochanteric fracture of left femur, initial encounter (HCC) 11/27/2020   History of stroke 08/24/2017   Paroxysmal A-fib (HCC) 03/28/2015   Mixed hyperlipidemia 03/28/2015    Past Surgical History:  Procedure Laterality Date   CATARACT EXTRACTION W/PHACO Left 01/21/2020   Procedure: CATARACT EXTRACTION PHACO AND INTRAOCULAR LENS PLACEMENT (IOC) LEFT 8.97  00:57.9;  Surgeon: Nevada Crane, MD;  Location: [facility];  Service: Ophthalmology;  Laterality: Left;   CATARACT EXTRACTION W/PHACO Right 03/10/2020   Procedure: CATARACT EXTRACTION PHACO AND INTRAOCULAR LENS PLACEMENT (IOC) RIGHT 11.55  01:10.2;  Surgeon: Nevada Crane, MD;  Location: [facility];  Service: Ophthalmology;  Laterality: Right;   CHOLECYSTECTOMY      COLONOSCOPY     COLONOSCOPY WITH PROPOFOL N/A 06/28/2017   Procedure: COLONOSCOPY WITH PROPOFOL;  Surgeon: Christena Deem, MD;  Location: [facility];  Service: Endoscopy;  Laterality: N/A;   HERNIA REPAIR     INTRAMEDULLARY (IM) NAIL INTERTROCHANTERIC Left 11/28/2020   Procedure: INTRAMEDULLARY (IM) NAIL INTERTROCHANTRIC;  Surgeon: Signa Kell, MD;  Location: [facility];  Service: Orthopedics;  Laterality: Left;   IR KYPHO LUMBAR INC FX REDUCE BONE BX UNI/BIL CANNULATION INC/IMAGING  12/30/2022   IR RADIOLOGIST EVAL & MGMT  01/25/2023   TONSILLECTOMY      OB History   No obstetric history on file.      Home Medications    Prior to Admission medications   Medication Sig Start Date End Date Taking? Authorizing Provider  acetaminophen (TYLENOL) 500 MG tablet Take 500 mg by mouth every 6 (six) hours as needed.   Yes [provider]  cholecalciferol (VITAMIN D) 25 MCG tablet Take 1 tablet (1,000 Units total) by mouth daily. 12/03/20  Yes Burnadette Pop, MD  docusate sodium (COLACE) 100 MG capsule Take 1 capsule (100 mg total) by mouth 2 (two) times daily. 12/03/20  Yes Burnadette Pop, MD  sertraline (ZOLOFT) 20 MG/ML concentrated solution Take by mouth daily.   Yes [provider]  ferrous fumarate-b12-vitamic C-folic acid (TRINSICON / FOLTRIN) capsule Take 1 capsule by mouth 2 (two) times daily. Patient not taking: Reported on 12/23/2022 12/03/20   Burnadette Pop, MD  oxyCODONE (  OXY IR/ROXICODONE) 5 MG immediate release tablet Take 0.5-1 tablets (2.5-5 mg total) by mouth every 6 (six) hours as needed for moderate pain or severe pain (pain score 4-6). Patient not taking: Reported on 12/23/2022 12/01/20   Dedra Skeens, PA-C  polyethylene glycol (MIRALAX / GLYCOLAX) 17 g packet Take 17 g by mouth daily. Patient not taking: Reported on 12/23/2022 12/03/20   Burnadette Pop, MD  rivaroxaban (XARELTO) 20 MG TABS tablet Take 20 mg by mouth daily after breakfast. 05/03/19    [provider]  rosuvastatin (CRESTOR) 5 MG tablet Take 5 mg by mouth daily after breakfast. 10/28/20   [provider]  sertraline (ZOLOFT) 100 MG tablet Take 150 mg by mouth daily after breakfast. 08/08/19   [provider]  traMADol (ULTRAM) 50 MG tablet Take 1 tablet (50 mg total) by mouth every 6 (six) hours as needed for moderate pain. Patient not taking: Reported on 12/23/2022 12/01/20   Dedra Skeens, PA-C  Vitamin D, Ergocalciferol, (DRISDOL) 1.25 MG (50000 UNIT) CAPS capsule Take 1 capsule (50,000 Units total) by mouth every 7 (seven) days. Patient not taking: Reported on 12/23/2022 12/03/20   Burnadette Pop, MD  apixaban (ELIQUIS) 5 MG TABS tablet Take 5 mg by mouth 2 (two) times daily.  09/29/19  [provider]  gabapentin (NEURONTIN) 300 MG capsule Take 300 mg by mouth at bedtime.  09/29/19  [provider]  ranitidine (ZANTAC) 75 MG tablet Take 1 tablet (75 mg total) by mouth 2 (two) times daily. 05/22/16 09/29/19  Schaevitz, Myra Rude, MD  zolpidem (AMBIEN) 5 MG tablet Take 5 mg by mouth at bedtime as needed for sleep.  09/29/19  [provider]    Family History Family History  Problem Relation Age of Onset   Depression Mother    Heart disease Father     Social History Social History   Tobacco Use   Smoking status: Never    Passive exposure: Current   Smokeless tobacco: Never  Vaping Use   Vaping status: Never Used  Substance Use Topics   Alcohol use: No   Drug use: No     Allergies   Fludrocortisone and Coumadin [warfarin sodium]   Review of Systems Review of Systems  Constitutional:  Positive for fatigue. Negative for chills, diaphoresis and fever.  HENT:  Negative for congestion, ear pain, rhinorrhea, sinus pressure, sinus pain and sore throat.   Respiratory:  Positive for cough and shortness of breath.   Cardiovascular:  Positive for chest pain. Negative for palpitations.  Gastrointestinal:  Negative  for abdominal pain, nausea and vomiting.  Musculoskeletal:  Negative for arthralgias and myalgias.  Skin:  Negative for rash.  Neurological:  Negative for weakness and headaches.  Hematological:  Negative for adenopathy.     Physical Exam Triage Vital Signs ED Triage Vitals [07/19/23 1447]  Encounter Vitals Group     BP 121/83     Systolic BP Percentile      Diastolic BP Percentile      Pulse Rate 96     Resp (!) 22     Temp 98.6 F (37 C)     Temp src      SpO2 95 %     Weight      Height      Head Circumference      Peak Flow      Pain Score 10     Pain Loc      Pain Education  Exclude from Growth Chart    No data found.  Updated Vital Signs BP 121/83   Pulse 96   Temp 98.6 F (37 C)   Resp (!) 22   SpO2 95%      Physical Exam Vitals and nursing note reviewed.  Constitutional:      General: She is not in acute distress.    Appearance: Normal appearance. She is well-developed. She is not ill-appearing or toxic-appearing.  HENT:     Head: Normocephalic and atraumatic.     Nose: Nose normal.     Mouth/Throat:     Mouth: Mucous membranes are moist.     Pharynx: Oropharynx is clear.  Eyes:     General: No scleral icterus.       Right eye: No discharge.        Left eye: No discharge.     Conjunctiva/sclera: Conjunctivae normal.  Cardiovascular:     Rate and Rhythm: Tachycardia present. Rhythm irregular.     Heart sounds: Normal heart sounds.  Pulmonary:     Effort: Pulmonary effort is normal. No respiratory distress.     Breath sounds: Normal breath sounds.  Abdominal:     Palpations: Abdomen is soft.     Tenderness: There is no abdominal tenderness.  Musculoskeletal:     Cervical back: Neck supple.  Skin:    General: Skin is dry.  Neurological:     General: No focal deficit present.     Mental Status: She is alert. Mental status is at baseline.     Motor: No weakness.     Gait: Gait normal.  Psychiatric:        Mood and Affect: Mood normal.         Behavior: Behavior normal.        Thought Content: Thought content normal.      UC Treatments / Results  Labs (all labs ordered are listed, but only abnormal results are displayed) Labs Reviewed - No data to display  EKG   Radiology No results found.  Procedures ED EKG  Date/Time: 07/19/2023 3:11 PM  Performed by: Shirlee Latch, PA-C Authorized by: Shirlee Latch, PA-C   Previous ECG:    Previous ECG:  Compared to current   Similarity:  Changes noted   Comparison ECG info:  A-fib with RVR and LBBB Interpretation:    Interpretation: abnormal   Rate:    ECG rate:  112   ECG rate assessment: tachycardic   Rhythm:    Rhythm: sinus rhythm and atrial fibrillation   Ectopy:    Ectopy: PVCs   QRS:    QRS axis:  Normal   QRS intervals:  Normal   QRS conduction: LBBB   Comments:     Atrial fibrillation with RVR, New LBBB  (including critical care time)  Medications Ordered in UC Medications - No data to display  Initial Impression / Assessment and Plan / UC Course  I have reviewed the triage vital signs and the nursing notes.  Pertinent labs & imaging results that were available during my care of the patient were reviewed by me and considered in my medical decision making (see chart for details).   87 year old female presents for chest pain/heaviness, shortness of breath, dry cough and fatigue x 2 days.  History of atrial fibrillation, mild late onset Alzheimer's, previous stroke, vertigo, heart failure.  Vitals are stable.  Pulse averages about 112 bpm.  Patient is overall well-appearing.  On exam irregular rhythm with  tachycardia noted.  Chest clear to auscultation.  EKG performed today compared to EKG from 2021 shows A-fib with RVR and left bundle branch block.  Left bundle branch block is new onset with RVR.    Reviewed with patient that she had changes on her EKG.  Advised this warrants workup in the emergency department since the left bundle branch  block is new.  She initially is not agreeable to going to the ED.  She states she is "right with the Shaune Pollack" and if it is her time to go its her time to go.  She says she does have family, 2 sons and a daughter.  Sons live in [place] and daughter lives in [place].  She agrees to allow [place] to give them a call.  Try to contact all 3 family members and was unable to get through to them.  Contacted EMS given concerns for possible cardiac event.  Patient has history of Alzheimer's and at this time does not want to go to ED.  EMS contacted and given report.  They again tried to contact her family without success.  Patient finally is agreeable to going to ED.  Leaving for [facility] in stable condition.   Final Clinical Impressions(s) / UC Diagnoses   Final diagnoses:  Chest pain, unspecified type  Atrial fibrillation with RVR (HCC)  LBBB (left bundle branch block)  Acute cough     Discharge Instructions      You have been advised to follow up immediately in the emergency department for concerning signs.symptoms. If you declined EMS transport, please have a family member take you directly to the ED at this time. Do not delay. Based on concerns about condition, if you do not follow up in th e ED, you may risk poor outcomes including worsening of condition, delayed treatment and potentially life threatening issues. If you have declined to go to the ED at this time, you should call your PCP immediately to set up a follow up appointment.  Go to ED for red flag symptoms, including; fevers you cannot reduce with Tylenol/Motrin, severe headaches, vision changes, numbness/weakness in part of the body, lethargy, confusion, intractable vomiting, severe dehydration, chest pain, breathing difficulty, severe persistent abdominal or pelvic pain, signs of severe infection (increased redness, swelling of an area), feeling faint or passing out, dizziness, etc. You should especially go to the ED for sudden acute  worsening of condition if you do not elect to go at this time.     ED Prescriptions   None    PDMP not reviewed this encounter.   Shirlee Latch, PA-C 07/19/23 2001

## 2023-07-19 NOTE — ED Notes (Signed)
Patient is being discharged from the Urgent Care and sent to the Emergency Department via EMS . Per Eusebio Friendly, PA, patient is in need of higher level of care due to SOB, Chest heaviness and EKG changes. Patient is aware and verbalizes understanding of plan of care.  Vitals:   07/19/23 1447  BP: 121/83  Pulse: 96  Resp: (!) 22  Temp: 98.6 F (37 C)  SpO2: 95%

## 2023-07-19 NOTE — Discharge Instructions (Signed)
You have been advised to follow up immediately in the emergency department for concerning signs.symptoms. If you declined EMS transport, please have a family member take you directly to the ED at this time. Do not delay. Based on concerns about condition, if you do not follow up in th e ED, you may risk poor outcomes including worsening of condition, delayed treatment and potentially life threatening issues. If you have declined to go to the ED at this time, you should call your PCP immediately to set up a follow up appointment. ° °Go to ED for red flag symptoms, including; fevers you cannot reduce with Tylenol/Motrin, severe headaches, vision changes, numbness/weakness in part of the body, lethargy, confusion, intractable vomiting, severe dehydration, chest pain, breathing difficulty, severe persistent abdominal or pelvic pain, signs of severe infection (increased redness, swelling of an area), feeling faint or passing out, dizziness, etc. You should especially go to the ED for sudden acute worsening of condition if you do not elect to go at this time.  °

## 2023-07-19 NOTE — ED Triage Notes (Signed)
Cough, Chest heaviness and shortness of breath x 2 days and getting progressively worse.

## 2023-07-19 NOTE — ED Triage Notes (Signed)
Presents via EMS from [facility]  States she went to be seen for a cough and some heaviness in chest  which started 2 days ago  No fever
